# Patient Record
Sex: Female | Born: 1950 | Race: White | Hispanic: No | Smoking: Never smoker
Health system: Southern US, Community
[De-identification: ages and names within clinical notes are randomized; demographics above are authoritative.]

## PROBLEM LIST (undated history)

## (undated) DIAGNOSIS — R0602 Shortness of breath: Secondary | ICD-10-CM

## (undated) DIAGNOSIS — E7800 Pure hypercholesterolemia, unspecified: Secondary | ICD-10-CM

## (undated) DIAGNOSIS — C801 Malignant (primary) neoplasm, unspecified: Secondary | ICD-10-CM

## (undated) DIAGNOSIS — N83209 Unspecified ovarian cyst, unspecified side: Secondary | ICD-10-CM

## (undated) DIAGNOSIS — G629 Polyneuropathy, unspecified: Secondary | ICD-10-CM

## (undated) DIAGNOSIS — F32A Depression, unspecified: Secondary | ICD-10-CM

## (undated) DIAGNOSIS — R51 Headache: Secondary | ICD-10-CM

## (undated) DIAGNOSIS — F419 Anxiety disorder, unspecified: Secondary | ICD-10-CM

## (undated) DIAGNOSIS — R7303 Prediabetes: Secondary | ICD-10-CM

## (undated) DIAGNOSIS — J189 Pneumonia, unspecified organism: Secondary | ICD-10-CM

## (undated) DIAGNOSIS — M797 Fibromyalgia: Secondary | ICD-10-CM

## (undated) DIAGNOSIS — I10 Essential (primary) hypertension: Secondary | ICD-10-CM

## (undated) DIAGNOSIS — K589 Irritable bowel syndrome without diarrhea: Secondary | ICD-10-CM

## (undated) DIAGNOSIS — F329 Major depressive disorder, single episode, unspecified: Secondary | ICD-10-CM

## (undated) HISTORY — PX: OTHER SURGICAL HISTORY: SHX169

## (undated) HISTORY — DX: Fibromyalgia: M79.7

## (undated) HISTORY — DX: Irritable bowel syndrome, unspecified: K58.9

## (undated) HISTORY — DX: Pure hypercholesterolemia, unspecified: E78.00

## (undated) HISTORY — DX: Polyneuropathy, unspecified: G62.9

## (undated) HISTORY — PX: LAPAROSCOPIC CHOLECYSTECTOMY: SUR755

## (undated) HISTORY — PX: DILATION AND CURETTAGE OF UTERUS: SHX78

## (undated) HISTORY — DX: Depression, unspecified: F32.A

## (undated) HISTORY — DX: Major depressive disorder, single episode, unspecified: F32.9

---

## 1987-01-29 DIAGNOSIS — C801 Malignant (primary) neoplasm, unspecified: Secondary | ICD-10-CM

## 1987-01-29 HISTORY — PX: COLON RESECTION: SHX5231

## 1987-01-29 HISTORY — DX: Malignant (primary) neoplasm, unspecified: C80.1

## 1994-01-28 HISTORY — PX: TOTAL ABDOMINAL HYSTERECTOMY: SHX209

## 1998-09-27 ENCOUNTER — Ambulatory Visit (HOSPITAL_COMMUNITY): Admission: RE | Admit: 1998-09-27 | Discharge: 1998-09-27 | Payer: Self-pay | Admitting: Gastroenterology

## 1998-09-27 ENCOUNTER — Encounter: Payer: Self-pay | Admitting: Gastroenterology

## 1998-10-19 ENCOUNTER — Ambulatory Visit (HOSPITAL_COMMUNITY): Admission: RE | Admit: 1998-10-19 | Discharge: 1998-10-19 | Payer: Self-pay | Admitting: Gastroenterology

## 1998-10-31 ENCOUNTER — Ambulatory Visit (HOSPITAL_COMMUNITY): Admission: RE | Admit: 1998-10-31 | Discharge: 1998-10-31 | Payer: Self-pay | Admitting: Gastroenterology

## 1998-11-02 ENCOUNTER — Encounter: Payer: Self-pay | Admitting: Gastroenterology

## 2004-02-16 ENCOUNTER — Ambulatory Visit: Payer: Self-pay | Admitting: Internal Medicine

## 2005-05-27 ENCOUNTER — Ambulatory Visit: Payer: Self-pay | Admitting: Internal Medicine

## 2006-06-23 ENCOUNTER — Ambulatory Visit: Payer: Self-pay | Admitting: Internal Medicine

## 2007-01-29 HISTORY — PX: BREAST BIOPSY: SHX20

## 2007-09-01 ENCOUNTER — Ambulatory Visit: Payer: Self-pay | Admitting: Internal Medicine

## 2008-02-18 ENCOUNTER — Ambulatory Visit: Payer: Self-pay | Admitting: Internal Medicine

## 2008-08-03 ENCOUNTER — Ambulatory Visit: Payer: Self-pay | Admitting: Internal Medicine

## 2008-08-23 ENCOUNTER — Emergency Department: Payer: Self-pay | Admitting: Emergency Medicine

## 2008-09-13 ENCOUNTER — Ambulatory Visit: Payer: Self-pay | Admitting: Family Medicine

## 2009-09-18 ENCOUNTER — Ambulatory Visit: Payer: Self-pay | Admitting: Family Medicine

## 2010-12-12 ENCOUNTER — Ambulatory Visit: Payer: Self-pay | Admitting: Family Medicine

## 2011-09-18 ENCOUNTER — Emergency Department: Payer: Self-pay | Admitting: Emergency Medicine

## 2011-09-18 LAB — COMPREHENSIVE METABOLIC PANEL
Alkaline Phosphatase: 56 U/L (ref 50–136)
BUN: 13 mg/dL (ref 7–18)
Bilirubin,Total: 0.4 mg/dL (ref 0.2–1.0)
Chloride: 103 mmol/L (ref 98–107)
Creatinine: 0.77 mg/dL (ref 0.60–1.30)
EGFR (African American): >60
EGFR (Non-African Amer.): >60
Glucose: 148 mg/dL — ABNORMAL HIGH (ref 65–99)
Osmolality: 286 (ref 275–301)
SGPT (ALT): 22 U/L (ref 12–78)
Sodium: 142 mmol/L (ref 136–145)
Total Protein: 7.3 g/dL (ref 6.4–8.2)

## 2011-09-18 LAB — CBC WITH DIFFERENTIAL/PLATELET
Basophil #: 0 10*3/uL (ref 0.0–0.1)
Basophil %: 0.6 %
Eosinophil %: 4.8 %
HGB: 15.1 g/dL (ref 12.0–16.0)
MCH: 33.5 pg (ref 26.0–34.0)
Monocyte #: 0.4 x10 3/mm (ref 0.2–0.9)
Monocyte %: 7 %
Neutrophil %: 56.8 %
RBC: 4.52 10*6/uL (ref 3.80–5.20)

## 2011-09-18 LAB — URINALYSIS, COMPLETE
Bacteria: NONE SEEN
Bilirubin,UR: NEGATIVE
Glucose,UR: NEGATIVE mg/dL (ref 0–75)
Leukocyte Esterase: NEGATIVE
RBC,UR: 2 /HPF (ref 0–5)
Squamous Epithelial: 1
WBC UR: <1 /HPF (ref 0–5)

## 2011-09-18 LAB — TROPONIN I
Troponin-I: <0.02 ng/mL
Troponin-I: <0.02 ng/mL

## 2011-09-18 LAB — TSH: Thyroid Stimulating Horm: 2.32 u[IU]/mL

## 2012-01-01 ENCOUNTER — Ambulatory Visit: Payer: Self-pay | Admitting: Internal Medicine

## 2012-05-12 ENCOUNTER — Ambulatory Visit: Payer: Self-pay | Admitting: Internal Medicine

## 2012-05-28 ENCOUNTER — Encounter (INDEPENDENT_AMBULATORY_CARE_PROVIDER_SITE_OTHER): Payer: Self-pay

## 2012-05-29 ENCOUNTER — Encounter (INDEPENDENT_AMBULATORY_CARE_PROVIDER_SITE_OTHER): Payer: Self-pay | Admitting: Surgery

## 2012-05-29 ENCOUNTER — Ambulatory Visit (INDEPENDENT_AMBULATORY_CARE_PROVIDER_SITE_OTHER): Payer: Medicare Other | Admitting: Surgery

## 2012-05-29 VITALS — BP 118/64 | HR 76 | Resp 16 | Ht 67.0 in | Wt 195.0 lb

## 2012-05-29 DIAGNOSIS — K8020 Calculus of gallbladder without cholecystitis without obstruction: Secondary | ICD-10-CM

## 2012-05-29 HISTORY — DX: Calculus of gallbladder without cholecystitis without obstruction: K80.20

## 2012-05-29 NOTE — Patient Instructions (Signed)
If he decides to have surgery to remove her gallbladder, call the office and ask for the surgery scheduler's and they can help you get this scheduled

## 2012-05-29 NOTE — Progress Notes (Signed)
NAME: Suzanne [name] DOB: Apr 09, 1950 MRN: [id number]                                                                                      DATE: 05/29/2012  PCP: Hyman Hopes, MD Referring Provider: No ref. provider found  IMPRESSION:  Symptomatic gallstones  PLAN:   I have recommended lap chole. I have discussed the indications for laparoscopic cholecystectomy with her and provided educational material. We have discussed the risks of surgery, including general risks such as bleeding, infection, lung and heart issues etc. We have also discussed the potential for injuries to other organs, bile duct leaks, and other unexpected events. We have also talked about the fact that this may need to be converted to open under certain circumstances. We discussed the typical post op recovery and the fact that there is a good likelihood of improvement in symptoms and return to normal activity.  She understands this and wishes to proceed to schedule surgery. I believe all of her questions have been answered.  She has had prior abdominal surgery so may make LC more difficult or mean she needs open                    CC:  Chief Complaint  Patient presents with  . Cholelithiasis    HPI:  Suzanne [name] is a 62 y.o.  female who presents for evaluation of gallstones.she has been having intermittent problems with some abdominal pain, right upper quadrant but a little bit into the right CVA area. She has some mild nausea. She has not been to the emergency room for this. She discusses with her primary physician who ordered a gallbladder ultrasound. This showed cholelithiasis without evidence of acute cholecystitis. Hepatic steatosis was incidentally noted. She was referred for possible cholecystectomy. 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 She also had problems with constipation and developed a chronic anal fissure and was treated here for  that back in 2002 by Dr. Lorelee New.  PMH:  has a past medical history of Hypercholesteremia; Fibromyalgia; Depression; Neuropathy; and IBS (irritable bowel syndrome).  PSH:   has past surgical history that includes Colon resection (1989) and Total abdominal hysterectomy (1996).  ALLERGIES:   Allergies  Allergen Reactions  . Contrast Media (Iodinated Diagnostic Agents) Swelling    MEDICATIONS: Current outpatient prescriptions:CLONAZEPAM PO, Take 2 tablets by mouth daily., Disp: , Rfl: ;  Coenzyme Q10 (CO Q 10 PO), Take by mouth daily., Disp: , Rfl: ;  Fenofibrate (TRICOR PO), Take 1 tablet by mouth daily., Disp: , Rfl: ;  gabapentin (NEURONTIN) 800 MG tablet, Take 800 mg by mouth 3 (three) times daily., Disp: , Rfl: ;  pravastatin (PRAVACHOL) 80 MG tablet, Take 80 mg by mouth daily., Disp: , Rfl:  TRIAMTERENE PO, Take 1 tablet by mouth daily., Disp: , Rfl: ;  Venlafaxine HCl (EFFEXOR PO), Take 2 tablets by mouth daily., Disp: , Rfl:   ROS: She has filled out our 12 point review of systems and it is negative except for IBS. EXAM:  VITAL SIGNS:  BP 118/64  Pulse 76  Resp 16  Ht 5\' 7"  (1.702 m)  Wt 195 lb (88.451 kg)  BMI 30.53 kg/m2  GENERAL:  The patient is alert, oriented, and generally healthy-appearing, NAD. Mood and affect are normal.  HEENT:  The head is normocephalic, the eyes nonicteric, the pupils were round regular and equal. EOMs are normal. Pharynx normal. Dentition good.  NECK:  The neck is supple and there are no masses or thyromegaly.  LUNGS: Normal respirations and clear to auscultation.  HEART: Regular rhythm, with no murmurs rubs or gallops. Pulses are intact carotid dorsalis pedis and posterior tibial. No significant varicosities are noted.  ABDOMEN: Soft, flat, and but very mildly tender in the right subcostal area, without guarding or rebound. No masses or organomegaly is noted. No hernias are noted. Bowel sounds are normal.  EXTREMITIES:  Good  range of motion, no edema.   DATA REVIEWED:  I have reviewed the old office notes including copies of notes going back to her original colon surgery. I also reviewed the ultrasound report    Suzanne [name] J 05/29/2012  CC: No ref. provider found, [name], [name], MD

## 2012-06-01 ENCOUNTER — Encounter (INDEPENDENT_AMBULATORY_CARE_PROVIDER_SITE_OTHER): Payer: Self-pay

## 2012-06-19 ENCOUNTER — Encounter (HOSPITAL_COMMUNITY): Payer: Self-pay | Admitting: Pharmacy Technician

## 2012-06-23 ENCOUNTER — Other Ambulatory Visit (INDEPENDENT_AMBULATORY_CARE_PROVIDER_SITE_OTHER): Payer: Self-pay | Admitting: Surgery

## 2012-06-24 ENCOUNTER — Encounter (HOSPITAL_COMMUNITY)
Admission: RE | Admit: 2012-06-24 | Discharge: 2012-06-24 | Disposition: A | Discharge status: Home / Self Care | Payer: Self-pay | Source: Ambulatory Visit | Attending: Surgery | Admitting: Surgery

## 2012-06-24 NOTE — Pre-Procedure Instructions (Signed)
Suzanne [name]  06/24/2012   Your procedure is scheduled on:  Wednesday, June 4th  Report to [facility] Short Stay Center at 0630 AM.  Come to main entrance "A" and go to east elevators go to 3rd floor and check in at Short Stay.  Call this number if you have problems the morning of surgery: [phone]   Remember:   Do not eat food or drink liquids after midnight.    Take these medicines the morning of surgery with A SIP OF WATER: Effexor, Klonopin if needed, Tylenol if needed, Neurontin   Do not wear jewelry, make-up or nail polish.  Do not wear lotions, powders, or perfumes, deodorant.  Do not shave 48 hours prior to surgery. Men may shave face and neck.  Do not bring valuables to the hospital.  Contacts, dentures or bridgework may not be worn into surgery.  Leave suitcase in the car. After surgery it may be brought to your room.  For patients admitted to the hospital, checkout time is 11:00 AM the day of discharge.   Patients discharged the day of surgery will not be allowed to drive home.    Special Instructions: Shower using CHG 2 nights before surgery and the night before surgery.  If you shower the day of surgery use CHG.  Use special wash - you have one bottle of CHG for all showers.  You should use approximately 1/3 of the bottle for each shower.   Please read over the following fact sheets that you were given: Pain Booklet, Coughing and Deep Breathing, MRSA Information and Surgical Site Infection Prevention

## 2012-07-17 ENCOUNTER — Encounter (HOSPITAL_COMMUNITY): Payer: Self-pay

## 2012-07-17 ENCOUNTER — Encounter (HOSPITAL_COMMUNITY)
Admission: RE | Admit: 2012-07-17 | Discharge: 2012-07-17 | Disposition: A | Discharge status: Home / Self Care | Payer: Medicare Other | Source: Ambulatory Visit | Attending: Surgery | Admitting: Surgery

## 2012-07-17 HISTORY — DX: Essential (primary) hypertension: I10

## 2012-07-17 HISTORY — DX: Shortness of breath: R06.02

## 2012-07-17 HISTORY — DX: Malignant (primary) neoplasm, unspecified: C80.1

## 2012-07-17 HISTORY — DX: Unspecified ovarian cyst, unspecified side: N83.209

## 2012-07-17 HISTORY — DX: Anxiety disorder, unspecified: F41.9

## 2012-07-17 HISTORY — DX: Headache: R51

## 2012-07-17 HISTORY — DX: Pneumonia, unspecified organism: J18.9

## 2012-07-17 LAB — CBC WITH DIFFERENTIAL/PLATELET
Basophils Absolute: 0 10*3/uL (ref 0.0–0.1)
Basophils Relative: 1 % (ref 0–1)
Eosinophils Absolute: 0.2 10*3/uL (ref 0.0–0.7)
Eosinophils Relative: 3 % (ref 0–5)
Lymphs Abs: 2.2 10*3/uL (ref 0.7–4.0)
MCH: 32.5 pg (ref 26.0–34.0)
MCHC: 34.2 g/dL (ref 30.0–36.0)
MCV: 95.1 fL (ref 78.0–100.0)
Neutrophils Relative %: 49 % (ref 43–77)
Platelets: 295 10*3/uL (ref 150–400)
RBC: 4.28 MIL/uL (ref 3.87–5.11)
RDW: 12.8 % (ref 11.5–15.5)

## 2012-07-17 LAB — COMPREHENSIVE METABOLIC PANEL
ALT: 19 U/L (ref 0–35)
AST: 28 U/L (ref 0–37)
Albumin: 3.9 g/dL (ref 3.5–5.2)
Alkaline Phosphatase: 31 U/L — ABNORMAL LOW (ref 39–117)
Calcium: 9.2 mg/dL (ref 8.4–10.5)
GFR calc Af Amer: 79 mL/min — ABNORMAL LOW (ref >90)
Potassium: 3.3 mEq/L — ABNORMAL LOW (ref 3.5–5.1)
Sodium: 141 mEq/L (ref 135–145)
Total Protein: 6.8 g/dL (ref 6.0–8.3)

## 2012-07-17 LAB — SURGICAL PCR SCREEN: MRSA, PCR: NEGATIVE

## 2012-07-17 NOTE — Pre-Procedure Instructions (Signed)
AQUA DENSLOW  07/17/2012   Your procedure is scheduled on: Wednesday, July 29, 2012  Report to [facility] Short Stay Center at 9:30 AM.  Call this number if you have problems the morning of surgery: [phone]   Remember:   Do not eat food or drink liquids after midnight.   Take these medicines the morning of surgery with A SIP OF WATER: gabapentin (NEURONTIN) 800 MG tablet, venlafaxine XR (EFFEXOR-XR) 150 MG 24 hr capsule if needed: acetaminophen (TYLENOL) 500 MG tablet for pain, clonazePAM (KLONOPIN) 0.5 MG tablet for anxiety Stop taking Aspirin  and herbal medications. Do not take any NSAIDs ie: Ibuprofen, Advil, Naproxen or any medication containing Aspirin. Stop taking -Pseudoephedrine ( found in Allergy Relief D)  Do not wear jewelry, make-up or nail polish.  Do not wear lotions, powders, or perfumes. You may wear deodorant.  Do not shave 48 hours prior to surgery.  Do not bring valuables to the hospital.  [facility] is not responsible  for any belongings or valuables.  Contacts, dentures or bridgework may not be worn into surgery.  Leave suitcase in the car. After surgery it may be brought to your room.  For patients admitted to the hospital, checkout time is 11:00 AM the day of discharge.   Patients discharged the day of surgery will not be allowed to drive home.  Name and phone number of your driver:   Special Instructions: Shower using CHG 2 nights before surgery and the night before surgery.  If you shower the day of surgery use CHG.  Use special wash - you have one bottle of CHG for all showers.  You should use approximately 1/3 of the bottle for each shower.   Please read over the following fact sheets that you were given: Pain Booklet, Coughing and Deep Breathing, MRSA Information and Surgical Site Infection Prevention

## 2012-07-17 NOTE — Progress Notes (Signed)
Pt denies SOB, chest pain , and being under the care of a cardiologist. Pt states that she was in the hospital around July 2013 or so for chest pain. Pt believes that she had an EKG and chest x ray done around that time although, not sure of the exact date. Results of the EKG, chest x ray, any cardiac studies and latest office notes were requested from [facility].

## 2012-07-21 ENCOUNTER — Other Ambulatory Visit (HOSPITAL_COMMUNITY): Payer: Self-pay

## 2012-07-22 ENCOUNTER — Telehealth (INDEPENDENT_AMBULATORY_CARE_PROVIDER_SITE_OTHER): Payer: Self-pay

## 2012-07-22 NOTE — Telephone Encounter (Signed)
Called pt with lab results - all normal with the exception of potassium.  Advised pt to start taking something for her low potassium level per Dr. Jamey Ripa.

## 2012-07-23 NOTE — Telephone Encounter (Signed)
Patient calling - she spoke with the pharmacist and they did not know what dose to recommend to patient since they did not know how low her potassium was. Patient got over the counter 595 mg Potassium to take once a day. I advised this should be okay since her potassium was not very low. She will take this until surgery. I told her I would make Dr Jamey Ripa aware and we would contact her if this is not okay.

## 2012-07-24 NOTE — Telephone Encounter (Signed)
This should work

## 2012-07-28 ENCOUNTER — Encounter (INDEPENDENT_AMBULATORY_CARE_PROVIDER_SITE_OTHER): Payer: Medicare Other | Admitting: Surgery

## 2012-07-28 MED ORDER — CEFAZOLIN SODIUM-DEXTROSE 2-3 GM-% IV SOLR
2.0000 g | INTRAVENOUS | Status: AC
  Administered 2012-07-29: 2 g via INTRAVENOUS
  Filled 2012-07-28: qty 50

## 2012-07-29 ENCOUNTER — Encounter (HOSPITAL_COMMUNITY): Payer: Self-pay | Admitting: Anesthesiology

## 2012-07-29 ENCOUNTER — Observation Stay (HOSPITAL_COMMUNITY)
Admission: RE | Admit: 2012-07-29 | Discharge: 2012-07-31 | Disposition: A | Discharge status: Home / Self Care | Payer: Medicare Other | Source: Ambulatory Visit | Attending: Surgery | Admitting: Surgery

## 2012-07-29 ENCOUNTER — Ambulatory Visit (HOSPITAL_COMMUNITY): Payer: Medicare Other | Admitting: Anesthesiology

## 2012-07-29 ENCOUNTER — Ambulatory Visit (HOSPITAL_COMMUNITY): Payer: Medicare Other

## 2012-07-29 ENCOUNTER — Encounter (HOSPITAL_COMMUNITY)
Admission: RE | Disposition: A | Discharge status: Home / Self Care | Payer: Self-pay | Source: Ambulatory Visit | Attending: Surgery

## 2012-07-29 DIAGNOSIS — Z01812 Encounter for preprocedural laboratory examination: Secondary | ICD-10-CM | POA: Insufficient documentation

## 2012-07-29 DIAGNOSIS — K8010 Calculus of gallbladder with chronic cholecystitis without obstruction: Secondary | ICD-10-CM

## 2012-07-29 DIAGNOSIS — I10 Essential (primary) hypertension: Secondary | ICD-10-CM | POA: Insufficient documentation

## 2012-07-29 HISTORY — PX: CHOLECYSTECTOMY: SHX55

## 2012-07-29 SURGERY — LAPAROSCOPIC CHOLECYSTECTOMY WITH INTRAOPERATIVE CHOLANGIOGRAM
Anesthesia: General | Site: Abdomen | Wound class: Contaminated

## 2012-07-29 MED ORDER — CHLORHEXIDINE GLUCONATE 4 % EX LIQD: 1.0000 "application " | Freq: Once | CUTANEOUS | Status: DC

## 2012-07-29 MED ORDER — ROCURONIUM BROMIDE 100 MG/10ML IV SOLN
INTRAVENOUS | Status: DC | PRN
  Administered 2012-07-29: 50 mg via INTRAVENOUS

## 2012-07-29 MED ORDER — FENTANYL CITRATE 0.05 MG/ML IJ SOLN
INTRAMUSCULAR | Status: DC | PRN
  Administered 2012-07-29 (×5): 50 ug via INTRAVENOUS

## 2012-07-29 MED ORDER — SODIUM CHLORIDE 0.9 % IV SOLN
INTRAVENOUS | Status: DC | PRN
  Administered 2012-07-29: 12:00:00

## 2012-07-29 MED ORDER — LIDOCAINE HCL (CARDIAC) 20 MG/ML IV SOLN
INTRAVENOUS | Status: DC | PRN
  Administered 2012-07-29: 50 mg via INTRAVENOUS

## 2012-07-29 MED ORDER — KCL IN DEXTROSE-NACL 20-5-0.45 MEQ/L-%-% IV SOLN
INTRAVENOUS | Status: DC
  Filled 2012-07-29 (×5): qty 1000

## 2012-07-29 MED ORDER — OXYCODONE HCL 5 MG PO TABS: 5.0000 mg | ORAL_TABLET | Freq: Once | ORAL | Status: DC | PRN

## 2012-07-29 MED ORDER — NEOSTIGMINE METHYLSULFATE 1 MG/ML IJ SOLN
INTRAMUSCULAR | Status: DC | PRN
  Administered 2012-07-29: 4 mg via INTRAVENOUS

## 2012-07-29 MED ORDER — ONDANSETRON HCL 4 MG/2ML IJ SOLN
4.0000 mg | Freq: Four times a day (QID) | INTRAMUSCULAR | Status: DC | PRN
  Administered 2012-07-31 (×2): 4 mg via INTRAVENOUS
  Filled 2012-07-29 (×3): qty 2

## 2012-07-29 MED ORDER — OXYCODONE-ACETAMINOPHEN 5-325 MG PO TABS: 1.0000 | ORAL_TABLET | ORAL | Status: DC | PRN

## 2012-07-29 MED ORDER — MIDAZOLAM HCL 5 MG/5ML IJ SOLN
INTRAMUSCULAR | Status: DC | PRN
  Administered 2012-07-29: 2 mg via INTRAVENOUS

## 2012-07-29 MED ORDER — DEXAMETHASONE SODIUM PHOSPHATE 4 MG/ML IJ SOLN
INTRAMUSCULAR | Status: DC | PRN
  Administered 2012-07-29: 4 mg via INTRAVENOUS

## 2012-07-29 MED ORDER — HYDROMORPHONE HCL PF 1 MG/ML IJ SOLN
INTRAMUSCULAR | Status: AC
  Administered 2012-07-29: 0.5 mg via INTRAVENOUS
  Filled 2012-07-29: qty 1

## 2012-07-29 MED ORDER — ONDANSETRON HCL 4 MG/2ML IJ SOLN
INTRAMUSCULAR | Status: DC | PRN
  Administered 2012-07-29: 4 mg via INTRAVENOUS

## 2012-07-29 MED ORDER — ACETAMINOPHEN 325 MG PO TABS
650.0000 mg | ORAL_TABLET | ORAL | Status: DC | PRN
  Administered 2012-07-31 (×2): 650 mg via ORAL
  Filled 2012-07-29 (×2): qty 2

## 2012-07-29 MED ORDER — LIDOCAINE HCL 4 % MT SOLN
OROMUCOSAL | Status: DC | PRN
  Administered 2012-07-29: 4 mL via TOPICAL

## 2012-07-29 MED ORDER — BUPIVACAINE-EPINEPHRINE PF 0.25-1:200000 % IJ SOLN
INTRAMUSCULAR | Status: AC
  Filled 2012-07-29: qty 30

## 2012-07-29 MED ORDER — MORPHINE SULFATE 10 MG/ML IJ SOLN
INTRAMUSCULAR | Status: DC | PRN
  Administered 2012-07-29: 4 mg via INTRAVENOUS
  Administered 2012-07-29: 2 mg via INTRAVENOUS
  Administered 2012-07-29: 4 mg via INTRAVENOUS

## 2012-07-29 MED ORDER — PROPOFOL 10 MG/ML IV BOLUS
INTRAVENOUS | Status: DC | PRN
  Administered 2012-07-29: 150 mg via INTRAVENOUS

## 2012-07-29 MED ORDER — HEMOSTATIC AGENTS (NO CHARGE) OPTIME
TOPICAL | Status: DC | PRN
  Administered 2012-07-29: 1 via TOPICAL

## 2012-07-29 MED ORDER — ACETAMINOPHEN 650 MG RE SUPP: 650.0000 mg | RECTAL | Status: DC | PRN

## 2012-07-29 MED ORDER — ONDANSETRON HCL 4 MG/2ML IJ SOLN: 4.0000 mg | Freq: Four times a day (QID) | INTRAMUSCULAR | Status: DC | PRN

## 2012-07-29 MED ORDER — GLYCOPYRROLATE 0.2 MG/ML IJ SOLN
INTRAMUSCULAR | Status: DC | PRN
  Administered 2012-07-29: 0.6 mg via INTRAVENOUS

## 2012-07-29 MED ORDER — OXYCODONE HCL 5 MG PO TABS
5.0000 mg | ORAL_TABLET | ORAL | Status: DC | PRN
  Administered 2012-07-29: 5 mg via ORAL
  Administered 2012-07-30 (×2): 10 mg via ORAL
  Filled 2012-07-29: qty 2
  Filled 2012-07-29: qty 1
  Filled 2012-07-29 (×2): qty 2

## 2012-07-29 MED ORDER — OXYCODONE HCL 5 MG/5ML PO SOLN: 5.0000 mg | Freq: Once | ORAL | Status: DC | PRN

## 2012-07-29 MED ORDER — HYDROMORPHONE HCL PF 2 MG/ML IJ SOLN
2.0000 mg | INTRAMUSCULAR | Status: DC | PRN
  Administered 2012-07-29: 1 mg via INTRAVENOUS
  Administered 2012-07-30 (×2): 2 mg via INTRAVENOUS
  Filled 2012-07-29 (×3): qty 2

## 2012-07-29 MED ORDER — ARTIFICIAL TEARS OP OINT
TOPICAL_OINTMENT | OPHTHALMIC | Status: DC | PRN
  Administered 2012-07-29: 1 via OPHTHALMIC

## 2012-07-29 MED ORDER — LACTATED RINGERS IV SOLN
INTRAVENOUS | Status: DC | PRN
  Administered 2012-07-29 (×2): via INTRAVENOUS

## 2012-07-29 MED ORDER — KCL IN DEXTROSE-NACL 20-5-0.45 MEQ/L-%-% IV SOLN
INTRAVENOUS | Status: AC
  Filled 2012-07-29: qty 1000

## 2012-07-29 MED ORDER — SODIUM CHLORIDE 0.9 % IJ SOLN
3.0000 mL | Freq: Two times a day (BID) | INTRAMUSCULAR | Status: DC
  Administered 2012-07-30 – 2012-07-31 (×3): 3 mL via INTRAVENOUS

## 2012-07-29 MED ORDER — MORPHINE SULFATE 2 MG/ML IJ SOLN
2.0000 mg | INTRAMUSCULAR | Status: DC | PRN
  Administered 2012-07-30: 2 mg via INTRAVENOUS
  Filled 2012-07-29: qty 1

## 2012-07-29 MED ORDER — BUPIVACAINE HCL (PF) 0.25 % IJ SOLN
INTRAMUSCULAR | Status: DC | PRN
  Administered 2012-07-29: 15 mL

## 2012-07-29 MED ORDER — SODIUM CHLORIDE 0.9 % IR SOLN
Status: DC | PRN
  Administered 2012-07-29: 1000 mL

## 2012-07-29 MED ORDER — SODIUM CHLORIDE 0.9 % IV SOLN
250.0000 mL | INTRAVENOUS | Status: DC | PRN
  Administered 2012-07-31: 250 mL via INTRAVENOUS

## 2012-07-29 MED ORDER — 0.9 % SODIUM CHLORIDE (POUR BTL) OPTIME
TOPICAL | Status: DC | PRN
  Administered 2012-07-29: 1000 mL

## 2012-07-29 MED ORDER — HYDROMORPHONE HCL PF 1 MG/ML IJ SOLN
0.2500 mg | INTRAMUSCULAR | Status: DC | PRN
  Administered 2012-07-29: 0.5 mg via INTRAVENOUS

## 2012-07-29 MED ORDER — LACTATED RINGERS IV SOLN
INTRAVENOUS | Status: DC
  Administered 2012-07-29: 11:00:00 via INTRAVENOUS

## 2012-07-29 MED ORDER — SODIUM CHLORIDE 0.9 % IJ SOLN: 3.0000 mL | INTRAMUSCULAR | Status: DC | PRN

## 2012-07-29 SURGICAL SUPPLY — 46 items
APPLIER CLIP 5 13 M/L LIGAMAX5 (MISCELLANEOUS) ×2
APPLIER CLIP ROT 10 11.4 M/L (STAPLE) ×2
BLADE SURG ROTATE 9660 (MISCELLANEOUS) IMPLANT
CANISTER SUCTION 2500CC (MISCELLANEOUS) ×2 IMPLANT
CHLORAPREP W/TINT 26ML (MISCELLANEOUS) ×2 IMPLANT
CLIP APPLIE 5 13 M/L LIGAMAX5 (MISCELLANEOUS) ×1 IMPLANT
CLIP APPLIE ROT 10 11.4 M/L (STAPLE) ×1 IMPLANT
CLOTH BEACON ORANGE TIMEOUT ST (SAFETY) ×2 IMPLANT
COVER MAYO STAND STRL (DRAPES) ×2 IMPLANT
COVER SURGICAL LIGHT HANDLE (MISCELLANEOUS) ×2 IMPLANT
DECANTER SPIKE VIAL GLASS SM (MISCELLANEOUS) IMPLANT
DERMABOND ADVANCED (GAUZE/BANDAGES/DRESSINGS) ×1
DERMABOND ADVANCED .7 DNX12 (GAUZE/BANDAGES/DRESSINGS) ×1 IMPLANT
DEVICE TROCAR PUNCTURE CLOSURE (ENDOMECHANICALS) ×2 IMPLANT
DRAPE C-ARM 42X72 X-RAY (DRAPES) ×2 IMPLANT
DRAPE UTILITY 15X26 W/TAPE STR (DRAPE) ×4 IMPLANT
ELECT REM PT RETURN 9FT ADLT (ELECTROSURGICAL) ×2
ELECTRODE REM PT RTRN 9FT ADLT (ELECTROSURGICAL) ×1 IMPLANT
FILTER SMOKE EVAC LAPAROSHD (FILTER) IMPLANT
GLOVE BIOGEL PI IND STRL 6 (GLOVE) ×1 IMPLANT
GLOVE BIOGEL PI IND STRL 7.5 (GLOVE) ×3 IMPLANT
GLOVE BIOGEL PI INDICATOR 6 (GLOVE) ×1
GLOVE BIOGEL PI INDICATOR 7.5 (GLOVE) ×3
GLOVE EUDERMIC 7 POWDERFREE (GLOVE) ×2 IMPLANT
GLOVE SURG SS PI 7.0 STRL IVOR (GLOVE) ×4 IMPLANT
GLOVE SURG SS PI 7.5 STRL IVOR (GLOVE) ×6 IMPLANT
GOWN STRL NON-REIN LRG LVL3 (GOWN DISPOSABLE) ×4 IMPLANT
GOWN STRL REIN XL XLG (GOWN DISPOSABLE) ×4 IMPLANT
HEMOSTAT SNOW SURGICEL 2X4 (HEMOSTASIS) ×2 IMPLANT
KIT BASIN OR (CUSTOM PROCEDURE TRAY) ×2 IMPLANT
KIT ROOM TURNOVER OR (KITS) ×2 IMPLANT
NS IRRIG 1000ML POUR BTL (IV SOLUTION) ×2 IMPLANT
PAD ARMBOARD 7.5X6 YLW CONV (MISCELLANEOUS) ×2 IMPLANT
POUCH SPECIMEN RETRIEVAL 10MM (ENDOMECHANICALS) ×2 IMPLANT
SCISSORS LAP 5X35 DISP (ENDOMECHANICALS) IMPLANT
SET CHOLANGIOGRAPH 5 50 .035 (SET/KITS/TRAYS/PACK) ×2 IMPLANT
SET IRRIG TUBING LAPAROSCOPIC (IRRIGATION / IRRIGATOR) ×2 IMPLANT
SLEEVE ENDOPATH XCEL 5M (ENDOMECHANICALS) ×2 IMPLANT
SPECIMEN JAR SMALL (MISCELLANEOUS) ×2 IMPLANT
SUT MNCRL AB 4-0 PS2 18 (SUTURE) ×2 IMPLANT
TOWEL OR 17X24 6PK STRL BLUE (TOWEL DISPOSABLE) ×2 IMPLANT
TOWEL OR 17X26 10 PK STRL BLUE (TOWEL DISPOSABLE) ×2 IMPLANT
TRAY LAPAROSCOPIC (CUSTOM PROCEDURE TRAY) ×2 IMPLANT
TROCAR XCEL BLUNT TIP 100MML (ENDOMECHANICALS) ×2 IMPLANT
TROCAR XCEL NON-BLD 11X100MML (ENDOMECHANICALS) ×4 IMPLANT
TROCAR XCEL NON-BLD 5MMX100MML (ENDOMECHANICALS) ×2 IMPLANT

## 2012-07-29 NOTE — H&P (Signed)
  HPI: Suzanne [name] is a 62 y.o. female who presents for cholecystectomy.she has been having intermittent problems with some abdominal pain, right upper quadrant but a little bit into the right CVA area. She has some mild nausea. She has not been to the emergency room for this. She discussed with her primary physician who ordered a gallbladder ultrasound. This showed cholelithiasis without evidence of acute cholecystitis. Hepatic steatosis was incidentally noted. She was referred for possible cholecystectomy. 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 She also had problems with constipation and developed a chronic anal fissure and was treated here for that back in 2002 by Dr. Lorelee New.  PMH: has a past medical history of Hypercholesteremia; Fibromyalgia; Depression; Neuropathy; and IBS (irritable bowel syndrome).  PSH: has past surgical history that includes Colon resection (1989) and Total abdominal hysterectomy (1996).  ALLERGIES:  Allergies   Allergen  Reactions   .  Contrast Media (Iodinated Diagnostic Agents)  Swelling   MEDICATIONS: Current outpatient prescriptions:CLONAZEPAM PO, Take 2 tablets by mouth daily., Disp: , Rfl: ; Coenzyme Q10 (CO Q 10 PO), Take by mouth daily., Disp: , Rfl: ; Fenofibrate (TRICOR PO), Take 1 tablet by mouth daily., Disp: , Rfl: ; gabapentin (NEURONTIN) 800 MG tablet, Take 800 mg by mouth 3 (three) times daily., Disp: , Rfl: ; pravastatin (PRAVACHOL) 80 MG tablet, Take 80 mg by mouth daily., Disp: , Rfl:  TRIAMTERENE PO, Take 1 tablet by mouth daily., Disp: , Rfl: ; Venlafaxine HCl (EFFEXOR PO), Take 2 tablets by mouth daily., Disp: , Rfl:  ROS: She  has filled out our 12 point review of systems and it is negative except for IBS.  EXAM:  VITAL SIGNS:  BP 170/71  Pulse 84  Temp(Src) 97.6 F (36.4 C) (Oral)  Resp 18  SpO2 96%  GENERAL:  The patient is alert, oriented, and generally  healthy-appearing, NAD. Mood and affect are normal.  HEENT:  The head is normocephalic, the eyes nonicteric, the pupils were round regular and equal. EOMs are normal. Pharynx normal. Dentition good.  NECK:  The neck is supple and there are no masses or thyromegaly.  LUNGS:  Normal respirations and clear to auscultation.  HEART:  Regular rhythm, with no murmurs rubs or gallops. Pulses are intact carotid dorsalis pedis and posterior tibial. No significant varicosities are noted.  ABDOMEN:  Soft, flat, and but very mildly tender in the right subcostal area, without guarding or rebound. No masses or organomegaly is noted. No hernias are noted. Bowel sounds are normal.  EXTREMITIES:  Good range of motion, no edema.  Imp: Symptomatic gallstones  Plan: Lap chole with IOC. Reviewed the plans with the patient again and all questions answered

## 2012-07-29 NOTE — Progress Notes (Signed)
REPORT CALLED AND TRANSFERRED TO 6N3 AFTER REPORT CALL TO RN FOR [phone]

## 2012-07-29 NOTE — Anesthesia Preprocedure Evaluation (Addendum)
Anesthesia Evaluation  Patient identified by MRN, date of birth, ID band Patient awake    Reviewed: Allergy & Precautions, H&P , NPO status , Patient's Chart, lab work & pertinent test results  Airway Mallampati: II  Neck ROM: full    Dental  (+) Dental Advisory Given   Pulmonary shortness of breath,          Cardiovascular hypertension,     Neuro/Psych  Headaches, Anxiety Depression  Neuromuscular disease    GI/Hepatic   Endo/Other    Renal/GU      Musculoskeletal  (+) Fibromyalgia -  Abdominal   Peds  Hematology   Anesthesia Other Findings   Reproductive/Obstetrics                          Anesthesia Physical Anesthesia Plan  ASA: II  Anesthesia Plan: General   Post-op Pain Management:    Induction: Intravenous  Airway Management Planned: Oral ETT  Additional Equipment:   Intra-op Plan:   Post-operative Plan: Extubation in OR  Informed Consent: I have reviewed the patients History and Physical, chart, labs and discussed the procedure including the risks, benefits and alternatives for the proposed anesthesia with the patient or authorized representative who has indicated his/her understanding and acceptance.     Plan Discussed with: CRNA, Anesthesiologist and Surgeon  Anesthesia Plan Comments:         Anesthesia Quick Evaluation

## 2012-07-29 NOTE — Transfer of Care (Signed)
Immediate Anesthesia Transfer of Care Note  Patient: Suzanne [name]  Procedure(s) Performed: Procedure(s): LAPAROSCOPIC CHOLECYSTECTOMY WITH INTRAOPERATIVE CHOLANGIOGRAM (N/A)  Patient Location: PACU  Anesthesia Type:General  Level of Consciousness: awake and alert   Airway & Oxygen Therapy: Patient Spontanous Breathing and Patient connected to nasal cannula oxygen  Post-op Assessment: Report given to PACU RN, Post -op Vital signs reviewed and stable and Patient moving all extremities  Post vital signs: Reviewed and stable  Complications: No apparent anesthesia complications

## 2012-07-29 NOTE — Op Note (Signed)
Suzanne [name] Feb 12, 1950 [id number] 06/01/2012  Preoperative diagnosis: chronic calculus cholecystitis  Postoperative diagnosis: the same  Procedure: laparoscopic cholecystectomy with intraoperative cholangiogram  Surgeon: Currie Paris, MD, FACS  Assistant surgeon: Dr. Claud Kelp   Anesthesia: General  Clinical History and Indications: This patient has known gallstones and comes in today for cholecystectomy.  Description of procedure: The patient was seen in the preoperative area. I reviewed the plans for the procedure with her as well as the risks and complications. She had no further questions and wished to proceed.  The patient was taken to the operating room. After satisfactory general endotracheal anesthesia had been obtained the abdomen was prepped and draped. A time out was done.  0.25% plain Marcaine was used at all incisions.I made a small incision in the right upper quadrant and using Optiview in the 5 mm camera was able to enter the abdomen safely under direct vision. Once the abdomen was insufflated I could see there were numerous adhesions to the old midline around the umbilicus from her prior surgery. However, the epigastric area was free of adhesions I was able to place a 10 mm trocar under direct vision at this point. The camera was placed into that incision and using scissors I was able to take adhesions down around the umbilicus. I then made short umbilical incision and inserted a 1011 trocar under direct vision. The camera was placed there.I then placed a final 5 mm trocar under direct vision in the right upper quadrant. The gallbladder was grasped and retracted over the liver. There were no lesions to the gallbladder. The liver looked completely normal.I.I opened the peritoneum over the cystic duct and identified a segment of cystic duct and cystic artery. Clips were placed on each. An intraoperative cholangiogram was then performed. A Cook catheter was  introduced percutaneously and placed in the cystic duct. The cholangiogram showed good filling of the common duct and hepatic radicals and free flow into the duodenum. No abnormalities were noted.  The catheter was removed and 3 clips placed on the stay side of the cystic duct. The duct was then divided.  Additional clips are placed on the cystic artery and it was divided. The gallbladder was then removed from below to above the coagulation current of the cautery. It was then placed in a bag to be retrieved later.  The abdomen was irrigated and a check for hemostasis along the bed of the gallbladder made.there is a small bleeding point above the posterior cystic artery which was clipped for some extra clips. I also put some snow in this area but appeared completely dry. Once everything appeared to be dry we were able to move the camera to the epigastric port and removed the gallbladder through the umbilical port.  The abdomen was reinsufflated and a final check for hemostasis made. There is no evidence of bleeding or bile leakage.the umbilical site was closed with a 0 Vicryl using an Endo Close. The lateral ports were removed under direct vision and there was no bleeding. The abdomen was then deflated through the epigastric port and that was removed. Skin was closed with 4-0 Monocryl subcuticular and Dermabond.  The patient tolerated the procedure well. There were no operative complications. EBL was minimal. All counts were correct.  Currie Paris, MD, FACS 07/29/2012 12:40 PM

## 2012-07-29 NOTE — Anesthesia Procedure Notes (Signed)
Procedure Name: Intubation Date/Time: 07/29/2012 11:26 AM Performed by: Luster Landsberg Pre-anesthesia Checklist: Patient identified, Emergency Drugs available, Suction available and Patient being monitored Patient Re-evaluated:Patient Re-evaluated prior to inductionOxygen Delivery Method: Circle system utilized Preoxygenation: Pre-oxygenation with 100% oxygen Intubation Type: IV induction Ventilation: Mask ventilation without difficulty and Oral airway inserted - appropriate to patient size Laryngoscope Size: Mac and 3 Grade View: Grade I Tube size: 7.5 mm Number of attempts: 1 Airway Equipment and Method: Stylet and LTA kit utilized Placement Confirmation: ETT inserted through vocal cords under direct vision,  positive ETCO2 and breath sounds checked- equal and bilateral Secured at: 21 cm Tube secured with: Tape Dental Injury: Teeth and Oropharynx as per pre-operative assessment

## 2012-07-29 NOTE — Anesthesia Postprocedure Evaluation (Signed)
Anesthesia Post Note  Patient: Suzanne [name]  Procedure(s) Performed: Procedure(s) (LRB): LAPAROSCOPIC CHOLECYSTECTOMY WITH INTRAOPERATIVE CHOLANGIOGRAM (N/A)  Anesthesia type: General  Patient location: PACU  Post pain: Pain level controlled and Adequate analgesia  Post assessment: Post-op Vital signs reviewed, Patient's Cardiovascular Status Stable, Respiratory Function Stable, Patent Airway and Pain level controlled  Last Vitals:  Filed Vitals:   07/29/12 1345  BP: 123/74  Pulse: 90  Temp:   Resp:     Post vital signs: Reviewed and stable  Level of consciousness: awake, alert  and oriented  Complications: No apparent anesthesia complications

## 2012-07-29 NOTE — Progress Notes (Signed)
DR [facility] NOTIFIED OF C/O PAIN AND ORDER NOTED

## 2012-07-30 ENCOUNTER — Observation Stay (HOSPITAL_COMMUNITY): Payer: Medicare Other

## 2012-07-30 ENCOUNTER — Encounter (HOSPITAL_COMMUNITY): Payer: Self-pay | Admitting: General Practice

## 2012-07-30 LAB — COMPREHENSIVE METABOLIC PANEL
AST: 90 U/L — ABNORMAL HIGH (ref 0–37)
BUN: 18 mg/dL (ref 6–23)
CO2: 30 mEq/L (ref 19–32)
Chloride: 102 mEq/L (ref 96–112)
Creatinine, Ser: 0.72 mg/dL (ref 0.50–1.10)
GFR calc Af Amer: >90 mL/min (ref >90)

## 2012-07-30 LAB — CBC
HCT: 36.9 % (ref 36.0–46.0)
Hemoglobin: 12.1 g/dL (ref 12.0–15.0)
MCV: 98.4 fL (ref 78.0–100.0)
Platelets: 269 10*3/uL (ref 150–400)
RBC: 3.75 MIL/uL — ABNORMAL LOW (ref 3.87–5.11)
WBC: 8.3 10*3/uL (ref 4.0–10.5)

## 2012-07-30 LAB — LIPASE, BLOOD: Lipase: 31 U/L (ref 11–59)

## 2012-07-30 MED ORDER — HEPARIN SODIUM (PORCINE) 5000 UNIT/ML IJ SOLN
5000.0000 [IU] | Freq: Three times a day (TID) | INTRAMUSCULAR | Status: DC
  Administered 2012-07-30 – 2012-07-31 (×2): 5000 [IU] via SUBCUTANEOUS
  Filled 2012-07-30 (×5): qty 1

## 2012-07-30 MED ORDER — OXYCODONE HCL 5 MG PO TABS
5.0000 mg | ORAL_TABLET | ORAL | Status: DC | PRN
  Administered 2012-07-31 (×2): 5 mg via ORAL
  Filled 2012-07-30 (×2): qty 1

## 2012-07-30 MED ORDER — TECHNETIUM TC 99M MEBROFENIN IV KIT
5.0000 | PACK | Freq: Once | INTRAVENOUS | Status: AC | PRN
  Administered 2012-07-30: 5 via INTRAVENOUS

## 2012-07-30 MED ORDER — HYDROMORPHONE HCL PF 1 MG/ML IJ SOLN
1.0000 mg | INTRAMUSCULAR | Status: DC | PRN
  Administered 2012-07-31: 1 mg via INTRAVENOUS
  Filled 2012-07-30: qty 1

## 2012-07-30 NOTE — Progress Notes (Signed)
1 Day Post-Op   Assessment: s/p Procedure(s): LAPAROSCOPIC CHOLECYSTECTOMY WITH INTRAOPERATIVE CHOLANGIOGRAM Patient Active Problem List   Diagnosis Date Noted  . Gallstones 05/29/2012    More pain than usual post lap chole, uncertain if represents bile leak or other problem Slt elevation of liver enzymes, normal bili, may just be post op  Plan: Will get HB scan today to be sure this isn't a bile leak  Subjective: Had more pain than usual, not nauseated, has voided and taken some PO's. Most of pain is right sided and around umbilical incision  Objective: Vital signs in last 24 hours: Temp:  [97.6 F (36.4 C)-99.1 F (37.3 C)] 98.4 F (36.9 C) (07/03 0535) Pulse Rate:  [66-106] 66 (07/03 0535) Resp:  [6-18] 16 (07/03 0535) BP: (97-170)/(54-90) 99/54 mmHg (07/03 0535) SpO2:  [90 %-100 %] 100 % (07/03 0535)   Intake/Output from previous day: 07/02 0701 - 07/03 0700 In: 2320 [I.V.:2320] Out: 25 [Blood:25]  General appearance: alert, cooperative and mild distress Resp: clear to auscultation bilaterally GI: Not distended, fairly soft on left, but a little less so on right. Some right tenderness and quite tender at umbilical incison. Left not tender. No reboud, a few BS present  Incision: healing well  Lab Results:   Recent Labs  07/30/12 0450  WBC 8.3  HGB 12.1  HCT 36.9  PLT 269   BMET  Recent Labs  07/30/12 0450  NA 140  K 3.8  CL 102  CO2 30  GLUCOSE 115*  BUN 18  CREATININE 0.72  CALCIUM 8.7    MEDS, Scheduled . sodium chloride  3 mL Intravenous Q12H    Studies/Results: Dg Chest 2 View  07/29/2012   *RADIOLOGY REPORT*  Clinical Data: Hypertension.  Preop radiograph.  CHEST - 2 VIEW  Comparison: None  Findings: The heart size and mediastinal contours are within normal limits.  Both lungs are clear.  The visualized skeletal structures are unremarkable.  IMPRESSION: Negative exam.   Original Report Authenticated By: Signa Kell, M.D.   Dg  Cholangiogram Operative  07/29/2012   *RADIOLOGY REPORT*  Clinical Data: Gallstones  INTRAOPERATIVE CHOLANGIOGRAM  Technique:  Multiple fluoroscopic spot radiographs were obtained during intraoperative cholangiogram and are submitted for interpretation post-operatively.  Comparison: None.  Findings: C-arm films demonstrate that the gallbladder has been removed and the cystic duct has been cannulated. There is faint opacification of the biliary tree with poor demonstration of the distal common bile duct, and ampulla.  Clear-cut documentation of contrast passage into the duodenum is not established on these images.  IMPRESSION: No filling defects are seen in the biliary tree nor is there significant biliary dilatation.  Clear-cut visualization of the distal CBD and passage of contrast into the duodenum is not established with these images.   Original Report Authenticated By: Davonna Belling, M.D.      LOS: 1 day     Currie Paris, MD, [facility], [place] [phone]   07/30/2012 8:18 AM

## 2012-07-30 NOTE — Progress Notes (Signed)
At 1530 Prior RN offered patient OXY IR 10 mg PO to help pain, but patient refused, Patient wanted IV Dilaudid 2 mg. Gave patient IV dilaudid 2 mg for pain rated a 20 out of 10 when asked, stated she was behind with her last pain medicine that was given. Explained to patient about non-pharmacological ways to manage pain, patient verbalized understanding of the information. Patient and husband anticipating potential discharge this evening.

## 2012-07-30 NOTE — Progress Notes (Addendum)
1 Day Post-Op   Assessment: s/p Procedure(s): LAPAROSCOPIC CHOLECYSTECTOMY WITH INTRAOPERATIVE CHOLANGIOGRAM Patient Active Problem List   Diagnosis Date Noted  . Gallstones 05/29/2012    Still with pain somewhat out of proportion to the usual post lap chole pain  Plan: Will try to manage on po meds tonight and repeat labs in the am. If she does not improve she will need a CT scanWill reduce Dilaudid to 1 mg as I thinkshe had some respiratory depression from that as well as slowed mentation. Discussed plans with patient and her husband. He wants her to go home, but told them I think we need to be sure there is not an intra-abd problem  Subjective: Still having pain, primarily on right abdomen. Didn't get pain meds while off the floor for HIDA scan and now less pain since her pain med. She is not nauseated and wants to go home (husband also wants her to go home) She thinks because she has fibromyalgia the pain is worse than the usual patient would have.   Objective: Vital signs in last 24 hours: Temp:  [98.2 F (36.8 C)-99.1 F (37.3 C)] 98.3 F (36.8 C) (07/03 1033) Pulse Rate:  [66-93] 71 (07/03 1033) Resp:  [16-19] 19 (07/03 1033) BP: (97-112)/(54-67) 108/62 mmHg (07/03 1033) SpO2:  [93 %-100 %] 97 % (07/03 1033)   Intake/Output from previous day: 07/02 0701 - 07/03 0700 In: 2320 [I.V.:2320] Out: 25 [Blood:25]  General appearance: alert, cooperative, mild distress, slowed mentation and I think mentation slow due to recent (20 mintues) dose of IV pain med.  Resp: clear to auscultation bilaterally and has had low O2 sats, but comes up readily with deep breaths GI: Still a little tender on Right , no rebound not distended soft on left a few BS present   Incision: healing well, slighlty tender incisions  Lab Results:   Recent Labs  07/30/12 0450  WBC 8.3  HGB 12.1  HCT 36.9  PLT 269   BMET  Recent Labs  07/30/12 0450  NA 140  K 3.8  CL 102  CO2 30  GLUCOSE  115*  BUN 18  CREATININE 0.72  CALCIUM 8.7    MEDS, Scheduled . sodium chloride  3 mL Intravenous Q12H    Studies/Results: Dg Chest 2 View  07/29/2012   *RADIOLOGY REPORT*  Clinical Data: Hypertension.  Preop radiograph.  CHEST - 2 VIEW  Comparison: None  Findings: The heart size and mediastinal contours are within normal limits.  Both lungs are clear.  The visualized skeletal structures are unremarkable.  IMPRESSION: Negative exam.   Original Report Authenticated By: Signa Kell, M.D.   Dg Cholangiogram Operative  07/29/2012   *RADIOLOGY REPORT*  Clinical Data: Gallstones  INTRAOPERATIVE CHOLANGIOGRAM  Technique:  Multiple fluoroscopic spot radiographs were obtained during intraoperative cholangiogram and are submitted for interpretation post-operatively.  Comparison: None.  Findings: C-arm films demonstrate that the gallbladder has been removed and the cystic duct has been cannulated. There is faint opacification of the biliary tree with poor demonstration of the distal common bile duct, and ampulla.  Clear-cut documentation of contrast passage into the duodenum is not established on these images.  IMPRESSION: No filling defects are seen in the biliary tree nor is there significant biliary dilatation.  Clear-cut visualization of the distal CBD and passage of contrast into the duodenum is not established with these images.   Original Report Authenticated By: Davonna Belling, M.D.   Nm Hepatobiliary  07/30/2012   *RADIOLOGY REPORT*  Clinical Data: Right upper quadrant pain, post cholecystectomy  NUCLEAR MEDICINE HEPATOHBILIARY INCLUDE GB  Radiopharmaceutical:  5.0 mCi Tc 33m Choletec  Comparison: None.  Findings: There is normal uptake of the tracer by the liver.  CBD and duodenum are visualized at 16 minutes.  There is no evidence of biliary leak.  At 60 minutes there is some clearing of activity from the liver.  Increasing bowel activity noted in the mid bowel. At 120 minutes there is almost complete  clearing of activity from the liver, CBD and duodenum.  No evidence of biliary leak. Activity noted only in distal small bowel.  IMPRESSION: Normal hepatobiliary scan.  No evidence of biliary leak.   Original Report Authenticated By: Natasha Mead, M.D.      LOS: 1 day     Currie Paris, MD, [facility], [place] [phone]   07/30/2012 7:07 PM

## 2012-07-31 LAB — CBC
HCT: 36.5 % (ref 36.0–46.0)
Hemoglobin: 12 g/dL (ref 12.0–15.0)
MCHC: 32.9 g/dL (ref 30.0–36.0)
RBC: 3.69 MIL/uL — ABNORMAL LOW (ref 3.87–5.11)

## 2012-07-31 LAB — COMPREHENSIVE METABOLIC PANEL
ALT: 55 U/L — ABNORMAL HIGH (ref 0–35)
Alkaline Phosphatase: 25 U/L — ABNORMAL LOW (ref 39–117)
BUN: 19 mg/dL (ref 6–23)
CO2: 30 mEq/L (ref 19–32)
GFR calc Af Amer: >90 mL/min (ref >90)
GFR calc non Af Amer: >90 mL/min (ref >90)
Glucose, Bld: 134 mg/dL — ABNORMAL HIGH (ref 70–99)
Potassium: 3.5 mEq/L (ref 3.5–5.1)
Sodium: 143 mEq/L (ref 135–145)
Total Bilirubin: 0.3 mg/dL (ref 0.3–1.2)

## 2012-07-31 MED ORDER — OXYCODONE HCL 5 MG PO TABS
5.0000 mg | ORAL_TABLET | ORAL | Status: DC | PRN
Start: 2012-07-31 — End: 2012-08-13

## 2012-07-31 NOTE — Progress Notes (Signed)
2 Days Post-Op  Subjective: Feeling better.  Has a headache.  Objective: Vital signs in last 24 hours: Temp:  [98.5 F (36.9 C)-100.2 F (37.9 C)] 98.8 F (37.1 C) (07/04 1032) Pulse Rate:  [84-107] 84 (07/04 1032) Resp:  [17-18] 17 (07/04 1032) BP: (120-138)/(58-91) 124/73 mmHg (07/04 1032) SpO2:  [90 %-95 %] 92 % (07/04 1032) Last BM Date: 07/28/12  Intake/Output from previous day:   Intake/Output this shift: Total I/O In: 240 [P.O.:240] Out: -   PE: General- In NAD Abdomen-soft, incisions are clean and intact  Lab Results:   Recent Labs  07/30/12 0450 07/31/12 0600  WBC 8.3 8.3  HGB 12.1 12.0  HCT 36.9 36.5  PLT 269 238   BMET  Recent Labs  07/30/12 0450 07/31/12 0600  NA 140 143  K 3.8 3.5  CL 102 103  CO2 30 30  GLUCOSE 115* 134*  BUN 18 19  CREATININE 0.72 0.59  CALCIUM 8.7 8.5   PT/INR No results found for this basename: LABPROT, INR,  in the last 72 hours Comprehensive Metabolic Panel:    Component Value Date/Time   NA 143 07/31/2012 0600   K 3.5 07/31/2012 0600   CL 103 07/31/2012 0600   CO2 30 07/31/2012 0600   BUN 19 07/31/2012 0600   CREATININE 0.59 07/31/2012 0600   GLUCOSE 134* 07/31/2012 0600   CALCIUM 8.5 07/31/2012 0600   AST 64* 07/31/2012 0600   ALT 55* 07/31/2012 0600   ALKPHOS 25* 07/31/2012 0600   BILITOT 0.3 07/31/2012 0600   PROT 6.5 07/31/2012 0600   ALBUMIN 3.5 07/31/2012 0600     Studies/Results: Dg Cholangiogram Operative  07/29/2012   *RADIOLOGY REPORT*  Clinical Data: Gallstones  INTRAOPERATIVE CHOLANGIOGRAM  Technique:  Multiple fluoroscopic spot radiographs were obtained during intraoperative cholangiogram and are submitted for interpretation post-operatively.  Comparison: None.  Findings: C-arm films demonstrate that the gallbladder has been removed and the cystic duct has been cannulated. There is faint opacification of the biliary tree with poor demonstration of the distal common bile duct, and ampulla.  Clear-cut documentation of  contrast passage into the duodenum is not established on these images.  IMPRESSION: No filling defects are seen in the biliary tree nor is there significant biliary dilatation.  Clear-cut visualization of the distal CBD and passage of contrast into the duodenum is not established with these images.   Original Report Authenticated By: Davonna Belling, M.D.   Nm Hepatobiliary  07/30/2012   *RADIOLOGY REPORT*  Clinical Data: Right upper quadrant pain, post cholecystectomy  NUCLEAR MEDICINE HEPATOHBILIARY INCLUDE GB  Radiopharmaceutical:  5.0 mCi Tc 11m Choletec  Comparison: None.  Findings: There is normal uptake of the tracer by the liver.  CBD and duodenum are visualized at 16 minutes.  There is no evidence of biliary leak.  At 60 minutes there is some clearing of activity from the liver.  Increasing bowel activity noted in the mid bowel. At 120 minutes there is almost complete clearing of activity from the liver, CBD and duodenum.  No evidence of biliary leak. Activity noted only in distal small bowel.  IMPRESSION: Normal hepatobiliary scan.  No evidence of biliary leak.   Original Report Authenticated By: Natasha Mead, M.D.    Anti-infectives: Anti-infectives   Start     Dose/Rate Route Frequency Ordered Stop   07/29/12 0600  ceFAZolin (ANCEF) IVPB 2 g/50 mL premix     2 g 100 mL/hr over 30 Minutes Intravenous On call to O.R. 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  Instructions given.   Suzanne [name] J 07/31/2012

## 2012-07-31 NOTE — Progress Notes (Signed)
Discharge Note. Pt and pt's husband present for discharge instructions. Medications were reviewed, Rx given. Signs and symptoms of when to call the doctor were reviewed. Discharge education complete.

## 2012-08-04 NOTE — Discharge Summary (Signed)
Physician Discharge Summary  Patient ID: Suzanne [name] MRN: [id number] DOB/AGE: 61/08/1950 61 y.o.  Admit date: 07/29/2012 Discharge date: 07/31/2012  Admission Diagnoses:  Chronic calculous cholecystitis  Discharge Diagnoses:   Same   Discharged Condition: good  Hospital Course: She underwent laparoscopic cholecystectomy with cholangiogram 07/29/2012. She has some significant postoperative pain. She underwent an evaluation to rule out a bile leak. Nuclear medicine hepatobiliary study was negative for this. She did feel significantly better by her second postoperative day and was able to be discharged. Discharge instructions were given to her.  Consults: None  Significant Diagnostic Studies: nuclear medicine: Hepatobiliary study  Treatments: surgery: Laparoscopic cholecystectomy with cholangiogram  Discharge Exam: Blood pressure 124/73, pulse 84, temperature 98.8 F (37.1 C), temperature source Oral, resp. rate 17, SpO2 92.00%.   Disposition: 01-Home or Self Care   Future Appointments Provider Department Dept Phone   08/13/2012 1:30 PM Currie Paris, MD [facility], [place] [phone]       Medication List         acetaminophen 500 MG tablet  Commonly known as:  TYLENOL  Take 1,500 mg by mouth every 6 (six) hours as needed for pain.     ALLERGY RELIEF D PO  Take 1 tablet by mouth daily.     clonazePAM 0.5 MG tablet  Commonly known as:  KLONOPIN  Take 0.5 mg by mouth 2 (two) times daily as needed for anxiety.     CO Q 10 PO  Take 1 capsule by mouth daily.     fenofibrate 160 MG tablet  Take 160 mg by mouth daily.     fluticasone 50 MCG/ACT nasal spray  Commonly known as:  FLONASE  Place 2 sprays into the nose daily.     gabapentin 800 MG tablet  Commonly known as:  NEURONTIN  Take 800 mg by mouth 3 (three) times daily.     meclizine 25 MG tablet  Commonly known as:  ANTIVERT  Take 25 mg by mouth 3 (three) times daily as needed.     oxyCODONE 5 MG immediate release tablet  Commonly known as:  Oxy IR/ROXICODONE  Take 1 tablet (5 mg total) by mouth every 4 (four) hours as needed.     oxyCODONE-acetaminophen 5-325 MG per tablet  Commonly known as:  ROXICET  Take 1 tablet by mouth every 4 (four) hours as needed for pain.     pravastatin 80 MG tablet  Commonly known as:  PRAVACHOL  Take 80 mg by mouth daily.     triamterene-hydrochlorothiazide 37.5-25 MG per tablet  Commonly known as:  MAXZIDE-25  Take 1 tablet by mouth daily.     venlafaxine XR 150 MG 24 hr capsule  Commonly known as:  EFFEXOR-XR  Take 150 mg by mouth 2 (two) times daily.           Follow-up Information   Follow up with Currie Paris, MD In 2 weeks.   Contact information:   [address] [place] [address] [phone]       Signed: Adolph Pollack 08/04/2012, 1:34 PM

## 2012-08-13 ENCOUNTER — Encounter (INDEPENDENT_AMBULATORY_CARE_PROVIDER_SITE_OTHER): Payer: Self-pay | Admitting: Surgery

## 2012-08-13 ENCOUNTER — Ambulatory Visit (INDEPENDENT_AMBULATORY_CARE_PROVIDER_SITE_OTHER): Payer: Medicare Other | Admitting: Surgery

## 2012-08-13 VITALS — BP 120/80 | HR 88 | Resp 16 | Ht 66.0 in | Wt 193.0 lb

## 2012-08-13 DIAGNOSIS — Z09 Encounter for follow-up examination after completed treatment for conditions other than malignant neoplasm: Secondary | ICD-10-CM

## 2012-08-13 DIAGNOSIS — K8020 Calculus of gallbladder without cholecystitis without obstruction: Secondary | ICD-10-CM

## 2012-08-13 NOTE — Progress Notes (Signed)
NAME: Suzanne [name]       DOB: 1950/07/25           DATE: 08/13/2012       ZOX:[id number]   CC:  Chief Complaint  Patient presents with  . Routine Post Op    lap chole 07/29/2012     Impression:  The patient appears to be doing well, with improvement in her symptoms.  Plan:  She may resume full activity and regular diet. She  will followup with [place] on a p.r.n. basis. I did tell her that she may still have some foods that cause indigestion and ask her to call us if there are any questions, problems or concerns.  HPI:  This patient underwent a laparoscopic cholecystectomy with operative cholangiogram on 7/2. She is in for her first postoperative visit. She notes that her incisional pain has resolved. Her preoperative symptoms have improved. She is not having problems with nausea, vomiting, diarrhea, fevers, chills, or urinary symptoms. She is tolerating diet. She feels that she is progressing well and nearly back to normal.She spent an extra night inm the hospital due to a fair amount of post op pain, but that has resolved. A HIDA scan was negative for leak or obstruction of the CBD PE:  VS: BP 120/80  Pulse 88  Resp 16  Ht 5\' 6"  (1.676 m)  Wt 193 lb (87.544 kg)  BMI 31.17 kg/m2  General: The patient is alert and appears comfortable, NAD.  Abdomen: Soft and benign. The incisions are healing nicely. There are no apparent problems.  Data reviewed: WJX:[id number]: C-arm films demonstrate that the gallbladder has been  removed and the cystic duct has been cannulated. There is faint  opacification of the biliary tree with poor demonstration of the  distal common bile duct, and ampulla. Clear-cut documentation of  contrast passage into the duodenum is not established on these  images.  IMPRESSION:  No filling defects are seen in the biliary tree nor is there  significant biliary dilatation. Clear-cut visualization of the  distal CBD and passage of contrast into the duodenum is not    HIDA:  Findings: There is normal uptake of the tracer by the liver. CBD  and duodenum are visualized at 16 minutes. There is no evidence of  biliary leak. At 60 minutes there is some clearing of activity  from the liver. Increasing bowel activity noted in the mid bowel.  At 120 minutes there is almost complete clearing of activity from  the liver, CBD and duodenum. No evidence of biliary leak.  Activity noted only in distal small bowel.  IMPRESSION:  Normal hepatobiliary scan. No evidence of biliary leak.   established with these images.  Pathology: Diagnosis Gallbladder - CHRONIC CHOLECYSTITIS AND CHOLELITHIASIS. Abigail Miyamoto MD Pathologist, Electronic Signature (Case signed 07/30/2012)

## 2012-08-13 NOTE — Patient Instructions (Signed)
We will see you again on an as needed basis. Please call the office at [phone] if you have any questions or concerns. Thank you for allowing us to take care of you.  

## 2012-10-26 ENCOUNTER — Encounter (INDEPENDENT_AMBULATORY_CARE_PROVIDER_SITE_OTHER): Payer: Self-pay

## 2014-06-14 ENCOUNTER — Other Ambulatory Visit: Payer: Self-pay | Admitting: Internal Medicine

## 2014-06-14 DIAGNOSIS — Z1231 Encounter for screening mammogram for malignant neoplasm of breast: Secondary | ICD-10-CM

## 2014-06-24 ENCOUNTER — Other Ambulatory Visit: Payer: Self-pay | Admitting: Internal Medicine

## 2014-06-24 ENCOUNTER — Ambulatory Visit
Admission: RE | Admit: 2014-06-24 | Discharge: 2014-06-24 | Disposition: A | Discharge status: Home / Self Care | Payer: Medicare Other | Source: Ambulatory Visit | Attending: Internal Medicine | Admitting: Internal Medicine

## 2014-06-24 DIAGNOSIS — Z1231 Encounter for screening mammogram for malignant neoplasm of breast: Secondary | ICD-10-CM

## 2015-12-20 ENCOUNTER — Other Ambulatory Visit: Payer: Self-pay | Admitting: Gastroenterology

## 2016-02-20 ENCOUNTER — Encounter (HOSPITAL_COMMUNITY): Payer: Self-pay

## 2016-02-20 ENCOUNTER — Ambulatory Visit (HOSPITAL_COMMUNITY): Admit: 2016-02-20 | Payer: Medicare Other | Admitting: Gastroenterology

## 2016-02-20 SURGERY — COLONOSCOPY WITH PROPOFOL
Anesthesia: Monitor Anesthesia Care

## 2016-03-26 ENCOUNTER — Other Ambulatory Visit: Payer: Self-pay | Admitting: Internal Medicine

## 2016-03-26 DIAGNOSIS — Z1231 Encounter for screening mammogram for malignant neoplasm of breast: Secondary | ICD-10-CM

## 2016-05-20 ENCOUNTER — Ambulatory Visit
Admission: RE | Admit: 2016-05-20 | Discharge: 2016-05-20 | Disposition: A | Discharge status: Home / Self Care | Payer: Medicare Other | Source: Ambulatory Visit | Attending: Internal Medicine | Admitting: Internal Medicine

## 2016-05-20 DIAGNOSIS — Z1231 Encounter for screening mammogram for malignant neoplasm of breast: Secondary | ICD-10-CM | POA: Insufficient documentation

## 2016-05-28 ENCOUNTER — Other Ambulatory Visit: Payer: Self-pay | Admitting: Gastroenterology

## 2016-06-17 ENCOUNTER — Encounter (HOSPITAL_COMMUNITY): Payer: Self-pay | Admitting: *Deleted

## 2016-06-18 ENCOUNTER — Ambulatory Visit (HOSPITAL_COMMUNITY)
Admission: RE | Admit: 2016-06-18 | Discharge: 2016-06-18 | Disposition: A | Discharge status: Home / Self Care | Payer: Medicare Other | Source: Ambulatory Visit | Attending: Gastroenterology | Admitting: Gastroenterology

## 2016-06-18 ENCOUNTER — Encounter (HOSPITAL_COMMUNITY): Payer: Self-pay | Admitting: *Deleted

## 2016-06-18 ENCOUNTER — Ambulatory Visit (HOSPITAL_COMMUNITY): Payer: Medicare Other | Admitting: Certified Registered Nurse Anesthetist

## 2016-06-18 ENCOUNTER — Encounter (HOSPITAL_COMMUNITY)
Admission: RE | Disposition: A | Discharge status: Home / Self Care | Payer: Self-pay | Source: Ambulatory Visit | Attending: Gastroenterology

## 2016-06-18 DIAGNOSIS — F329 Major depressive disorder, single episode, unspecified: Secondary | ICD-10-CM | POA: Diagnosis not present

## 2016-06-18 DIAGNOSIS — Z79899 Other long term (current) drug therapy: Secondary | ICD-10-CM | POA: Diagnosis not present

## 2016-06-18 DIAGNOSIS — I10 Essential (primary) hypertension: Secondary | ICD-10-CM | POA: Diagnosis not present

## 2016-06-18 DIAGNOSIS — E7800 Pure hypercholesterolemia, unspecified: Secondary | ICD-10-CM | POA: Diagnosis not present

## 2016-06-18 DIAGNOSIS — F419 Anxiety disorder, unspecified: Secondary | ICD-10-CM | POA: Insufficient documentation

## 2016-06-18 DIAGNOSIS — Z1211 Encounter for screening for malignant neoplasm of colon: Secondary | ICD-10-CM | POA: Diagnosis present

## 2016-06-18 DIAGNOSIS — Z85040 Personal history of malignant carcinoid tumor of rectum: Secondary | ICD-10-CM | POA: Insufficient documentation

## 2016-06-18 HISTORY — PX: COLONOSCOPY WITH PROPOFOL: SHX5780

## 2016-06-18 SURGERY — COLONOSCOPY WITH PROPOFOL
Anesthesia: Monitor Anesthesia Care

## 2016-06-18 MED ORDER — PROPOFOL 500 MG/50ML IV EMUL
INTRAVENOUS | Status: DC | PRN
  Administered 2016-06-18: 200 ug/kg/min via INTRAVENOUS

## 2016-06-18 MED ORDER — SODIUM CHLORIDE 0.9 % IV SOLN: INTRAVENOUS | Status: DC

## 2016-06-18 MED ORDER — ONDANSETRON HCL 4 MG/2ML IJ SOLN
INTRAMUSCULAR | Status: AC
  Filled 2016-06-18: qty 2

## 2016-06-18 MED ORDER — PROPOFOL 10 MG/ML IV BOLUS
INTRAVENOUS | Status: DC | PRN
  Administered 2016-06-18: 10 mg via INTRAVENOUS

## 2016-06-18 MED ORDER — LACTATED RINGERS IV SOLN
INTRAVENOUS | Status: DC
  Administered 2016-06-18: 07:00:00 via INTRAVENOUS

## 2016-06-18 MED ORDER — PROPOFOL 10 MG/ML IV BOLUS
INTRAVENOUS | Status: AC
  Filled 2016-06-18: qty 40

## 2016-06-18 MED ORDER — ONDANSETRON HCL 4 MG/2ML IJ SOLN
INTRAMUSCULAR | Status: DC | PRN
  Administered 2016-06-18: 4 mg via INTRAVENOUS

## 2016-06-18 SURGICAL SUPPLY — 21 items

## 2016-06-18 NOTE — Op Note (Signed)
[facility] Patient Name: Suzanne [name] Procedure Date: 06/18/2016 MRN: [id number] Attending MD: Garlan Fair , MD Date of Birth: 1950/05/20 CSN: [id number] Age: 66 Admit Type: Outpatient Procedure:                Colonoscopy Indications:              Screening for colorectal malignant neoplasm. 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                            Normal screening colonoscopies were performed in                            April 2007 and November 2012 Providers:                Garlan Fair, MD, Cleda Daub, RN, Tinnie Gens, Technician Referring MD:              Medicines:                Propofol per Anesthesia Complications:            No immediate complications. Estimated Blood Loss:     Estimated blood loss: none. Procedure:                Pre-Anesthesia Assessment:                           - Prior to the procedure, a History and Physical                            was performed, and patient medications and                            allergies were reviewed. The patient's tolerance of                            previous anesthesia was also reviewed. The risks                            and benefits of the procedure and the sedation                            options and risks were discussed with the patient.                            All questions were answered, and informed consent                            was obtained. Prior Anticoagulants: The patient has                            taken  no previous anticoagulant or antiplatelet                            agents. ASA Grade Assessment: II - A patient with                            mild systemic disease. After reviewing the risks                            and benefits, the patient was deemed in                            satisfactory condition to undergo the procedure.                           After obtaining  informed consent, the colonoscope                            was passed under direct vision. Throughout the                            procedure, the patient's blood pressure, pulse, and                            oxygen saturations were monitored continuously. The                            EC-[id number] ([id number]) scope was introduced through                            the anus and advanced to the the cecum, identified                            by appendiceal orifice and ileocecal valve. The                            colonoscopy was performed without difficulty. The                            patient tolerated the procedure well. The quality                            of the bowel preparation was good. The appendiceal                            orifice and the rectum were photographed. Scope In: 4:58:09 AM Scope Out: 8:13:52 AM Scope Withdrawal Time: 0 hours 5 minutes 24 seconds  Total Procedure Duration: 0 hours 27 minutes 44 seconds  Findings:      The perianal and digital rectal examinations were normal.      The entire examined colon appeared normal. Impression:               - The entire examined colon is normal.                           -  No specimens collected. Moderate Sedation:      N/A- Per Anesthesia Care Recommendation:           - Patient has a contact number available for                            emergencies. The signs and symptoms of potential                            delayed complications were discussed with the                            patient. Return to normal activities tomorrow.                            Written discharge instructions were provided to the                            patient.                           - Repeat colonoscopy in 10 years for screening                            purposes.                           - Resume previous diet.                           - Continue present medications. Procedure Code(s):        --- Professional ---                            Z6109, Colorectal cancer screening; colonoscopy on                            individual not meeting criteria for high risk Diagnosis Code(s):        --- Professional ---                           Z12.11, Encounter for screening for malignant                            neoplasm of colon CPT copyright 2016 American Medical Association. All rights reserved. The codes documented in this report are preliminary and upon coder review may  be revised to meet current compliance requirements. Earle Gell, MD Garlan Fair, MD 06/18/2016 8:21:13 AM This report has been signed electronically. Number of Addenda: 0

## 2016-06-18 NOTE — H&P (Signed)
Procedure: Screening colonoscopy. Rectal carcinoid tumor removed surgically in 1989. Normal screening colonoscopies were performed in April 2007 and November 2012  History: The patient is a 66 year old female born 1950-04-24. She is scheduled to undergo a repeat screening colonoscopy today.  Medication allergies: Intravenous contrast causes rash  Past medical history: Total abdominal hysterectomy with BSO. 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 Hypertension. Hypercholesterolemia.  Exam: The patient is alert and lying comfortably on the endoscopy stretcher. Abdomen is soft and nontender to palpation. Lungs are clear to auscultation. Cardiac exam reveals a regular rhythm.  Plan: Proceed with screening colonoscopy

## 2016-06-18 NOTE — Anesthesia Postprocedure Evaluation (Signed)
Anesthesia Post Note  Patient: Suzanne [name]  Procedure(s) Performed: Procedure(s) (LRB): COLONOSCOPY WITH PROPOFOL (N/A)  Patient location during evaluation: PACU Anesthesia Type: MAC Level of consciousness: awake and alert Pain management: pain level controlled Vital Signs Assessment: post-procedure vital signs reviewed and stable Respiratory status: spontaneous breathing, nonlabored ventilation, respiratory function stable and patient connected to nasal cannula oxygen Cardiovascular status: stable and blood pressure returned to baseline Anesthetic complications: no       Last Vitals:  Vitals:   06/18/16 0650 06/18/16 0820  BP: (!) 149/70 132/67  Pulse: 80   Resp: 15 13  Temp: 36.9 C 36.7 C    Last Pain:  Vitals:   06/18/16 0820  TempSrc: Oral                 [name]

## 2016-06-18 NOTE — Anesthesia Preprocedure Evaluation (Signed)
Anesthesia Evaluation  Patient identified by MRN, date of birth, ID band Patient awake    Reviewed: Allergy & Precautions, NPO status , Patient's Chart, lab work & pertinent test results  Airway Mallampati: II  TM Distance: >3 FB Neck ROM: Full    Dental no notable dental hx.    Pulmonary neg pulmonary ROS,    Pulmonary exam normal breath sounds clear to auscultation       Cardiovascular hypertension, Normal cardiovascular exam Rhythm:Regular Rate:Normal     Neuro/Psych negative neurological ROS  negative psych ROS   GI/Hepatic negative GI ROS, Neg liver ROS,   Endo/Other  negative endocrine ROS  Renal/GU negative Renal ROS  negative genitourinary   Musculoskeletal negative musculoskeletal ROS (+)   Abdominal   Peds negative pediatric ROS (+)  Hematology negative hematology ROS (+)   Anesthesia Other Findings   Reproductive/Obstetrics negative OB ROS                             Anesthesia Physical Anesthesia Plan  ASA: II  Anesthesia Plan: MAC   Post-op Pain Management:    Induction: Intravenous  Airway Management Planned: Simple Face Mask  Additional Equipment:   Intra-op Plan:   Post-operative Plan:   Informed Consent: I have reviewed the patients History and Physical, chart, labs and discussed the procedure including the risks, benefits and alternatives for the proposed anesthesia with the patient or authorized representative who has indicated his/her understanding and acceptance.   Dental advisory given  Plan Discussed with: CRNA and Surgeon  Anesthesia Plan Comments:         Anesthesia Quick Evaluation

## 2016-06-18 NOTE — Transfer of Care (Signed)
Immediate Anesthesia Transfer of Care Note  Patient: Suzanne [name]  Procedure(s) Performed: Procedure(s): COLONOSCOPY WITH PROPOFOL (N/A)  Patient Location: PACU  Anesthesia Type:MAC  Level of Consciousness:  sedated, patient cooperative and responds to stimulation  Airway & Oxygen Therapy:Patient Spontanous Breathing and Patient connected to face mask oxgen  Post-op Assessment:  Report given to PACU RN and Post -op Vital signs reviewed and stable  Post vital signs:  Reviewed and stable  Last Vitals:  Vitals:   06/18/16 0650  BP: (!) 149/70  Pulse: 80  Resp: 15  Temp: 29.5 C    Complications: No apparent anesthesia complications

## 2016-06-18 NOTE — Discharge Instructions (Signed)

## 2016-07-01 NOTE — Anesthesia Postprocedure Evaluation (Signed)
Anesthesia Post Note  Patient: Suzanne [name]  Procedure(s) Performed: Procedure(s) (LRB): COLONOSCOPY WITH PROPOFOL (N/A)     Anesthesia Post Evaluation  Last Vitals:  Vitals:   06/18/16 0820 06/18/16 0835  BP: 132/67 125/79  Pulse:    Resp: 13 13  Temp: 36.7 C     Last Pain:  Vitals:   06/18/16 0820  TempSrc: Oral                 [name]

## 2016-07-01 NOTE — Addendum Note (Signed)
Addendum  created 07/01/16 1442 by Myrtie Soman, MD   Sign clinical note

## 2017-03-03 DIAGNOSIS — E785 Hyperlipidemia, unspecified: Secondary | ICD-10-CM | POA: Diagnosis not present

## 2017-03-03 DIAGNOSIS — Z1389 Encounter for screening for other disorder: Secondary | ICD-10-CM | POA: Diagnosis not present

## 2017-03-03 DIAGNOSIS — E663 Overweight: Secondary | ICD-10-CM | POA: Diagnosis not present

## 2017-03-03 DIAGNOSIS — M797 Fibromyalgia: Secondary | ICD-10-CM | POA: Diagnosis not present

## 2017-03-03 DIAGNOSIS — I10 Essential (primary) hypertension: Secondary | ICD-10-CM | POA: Diagnosis not present

## 2017-03-03 DIAGNOSIS — F418 Other specified anxiety disorders: Secondary | ICD-10-CM | POA: Diagnosis not present

## 2017-03-03 DIAGNOSIS — K760 Fatty (change of) liver, not elsewhere classified: Secondary | ICD-10-CM | POA: Diagnosis not present

## 2017-03-03 DIAGNOSIS — L301 Dyshidrosis [pompholyx]: Secondary | ICD-10-CM | POA: Diagnosis not present

## 2017-04-18 DIAGNOSIS — H25093 Other age-related incipient cataract, bilateral: Secondary | ICD-10-CM | POA: Diagnosis not present

## 2017-05-05 DIAGNOSIS — I10 Essential (primary) hypertension: Secondary | ICD-10-CM | POA: Diagnosis not present

## 2017-05-05 DIAGNOSIS — L301 Dyshidrosis [pompholyx]: Secondary | ICD-10-CM | POA: Diagnosis not present

## 2017-05-05 DIAGNOSIS — L299 Pruritus, unspecified: Secondary | ICD-10-CM | POA: Diagnosis not present

## 2017-05-05 DIAGNOSIS — F418 Other specified anxiety disorders: Secondary | ICD-10-CM | POA: Diagnosis not present

## 2017-05-05 DIAGNOSIS — Z0000 Encounter for general adult medical examination without abnormal findings: Secondary | ICD-10-CM | POA: Diagnosis not present

## 2017-05-06 ENCOUNTER — Other Ambulatory Visit: Payer: Self-pay | Admitting: Internal Medicine

## 2017-05-06 DIAGNOSIS — N951 Menopausal and female climacteric states: Secondary | ICD-10-CM

## 2017-05-06 DIAGNOSIS — Z1231 Encounter for screening mammogram for malignant neoplasm of breast: Secondary | ICD-10-CM

## 2017-05-19 DIAGNOSIS — C44319 Basal cell carcinoma of skin of other parts of face: Secondary | ICD-10-CM | POA: Diagnosis not present

## 2017-06-05 ENCOUNTER — Ambulatory Visit
Admission: RE | Admit: 2017-06-05 | Discharge: 2017-06-05 | Disposition: A | Discharge status: Home / Self Care | Payer: PPO | Source: Ambulatory Visit | Attending: Internal Medicine | Admitting: Internal Medicine

## 2017-06-05 DIAGNOSIS — N951 Menopausal and female climacteric states: Secondary | ICD-10-CM | POA: Diagnosis not present

## 2017-06-05 DIAGNOSIS — Z1231 Encounter for screening mammogram for malignant neoplasm of breast: Secondary | ICD-10-CM | POA: Diagnosis not present

## 2017-06-05 DIAGNOSIS — Z13820 Encounter for screening for osteoporosis: Secondary | ICD-10-CM | POA: Diagnosis not present

## 2017-07-24 DIAGNOSIS — L301 Dyshidrosis [pompholyx]: Secondary | ICD-10-CM | POA: Diagnosis not present

## 2017-08-06 DIAGNOSIS — F418 Other specified anxiety disorders: Secondary | ICD-10-CM | POA: Diagnosis not present

## 2017-08-06 DIAGNOSIS — M797 Fibromyalgia: Secondary | ICD-10-CM | POA: Diagnosis not present

## 2017-08-06 DIAGNOSIS — K5900 Constipation, unspecified: Secondary | ICD-10-CM | POA: Diagnosis not present

## 2017-08-06 DIAGNOSIS — K589 Irritable bowel syndrome without diarrhea: Secondary | ICD-10-CM | POA: Diagnosis not present

## 2017-08-20 DIAGNOSIS — F418 Other specified anxiety disorders: Secondary | ICD-10-CM | POA: Diagnosis not present

## 2017-09-26 DIAGNOSIS — I10 Essential (primary) hypertension: Secondary | ICD-10-CM | POA: Diagnosis not present

## 2017-09-26 DIAGNOSIS — F418 Other specified anxiety disorders: Secondary | ICD-10-CM | POA: Diagnosis not present

## 2017-10-20 DIAGNOSIS — Z0000 Encounter for general adult medical examination without abnormal findings: Secondary | ICD-10-CM | POA: Diagnosis not present

## 2017-11-04 DIAGNOSIS — L299 Pruritus, unspecified: Secondary | ICD-10-CM | POA: Diagnosis not present

## 2017-11-04 DIAGNOSIS — L280 Lichen simplex chronicus: Secondary | ICD-10-CM | POA: Diagnosis not present

## 2017-11-04 DIAGNOSIS — L209 Atopic dermatitis, unspecified: Secondary | ICD-10-CM | POA: Diagnosis not present

## 2017-11-04 DIAGNOSIS — R531 Weakness: Secondary | ICD-10-CM | POA: Diagnosis not present

## 2017-11-19 DIAGNOSIS — E785 Hyperlipidemia, unspecified: Secondary | ICD-10-CM | POA: Diagnosis not present

## 2017-11-19 DIAGNOSIS — R531 Weakness: Secondary | ICD-10-CM | POA: Diagnosis not present

## 2017-11-24 DIAGNOSIS — R350 Frequency of micturition: Secondary | ICD-10-CM | POA: Diagnosis not present

## 2017-11-24 DIAGNOSIS — F332 Major depressive disorder, recurrent severe without psychotic features: Secondary | ICD-10-CM | POA: Diagnosis not present

## 2017-11-24 DIAGNOSIS — F418 Other specified anxiety disorders: Secondary | ICD-10-CM | POA: Diagnosis not present

## 2017-11-24 DIAGNOSIS — N368 Other specified disorders of urethra: Secondary | ICD-10-CM | POA: Diagnosis not present

## 2017-11-24 DIAGNOSIS — Z23 Encounter for immunization: Secondary | ICD-10-CM | POA: Diagnosis not present

## 2017-11-25 DIAGNOSIS — N8189 Other female genital prolapse: Secondary | ICD-10-CM | POA: Diagnosis not present

## 2017-12-02 DIAGNOSIS — L209 Atopic dermatitis, unspecified: Secondary | ICD-10-CM | POA: Diagnosis not present

## 2017-12-02 DIAGNOSIS — L280 Lichen simplex chronicus: Secondary | ICD-10-CM | POA: Diagnosis not present

## 2017-12-24 DIAGNOSIS — I10 Essential (primary) hypertension: Secondary | ICD-10-CM | POA: Diagnosis not present

## 2017-12-24 DIAGNOSIS — F332 Major depressive disorder, recurrent severe without psychotic features: Secondary | ICD-10-CM | POA: Diagnosis not present

## 2018-01-11 DIAGNOSIS — J018 Other acute sinusitis: Secondary | ICD-10-CM | POA: Diagnosis not present

## 2018-01-26 DIAGNOSIS — R05 Cough: Secondary | ICD-10-CM | POA: Diagnosis not present

## 2018-01-26 DIAGNOSIS — R238 Other skin changes: Secondary | ICD-10-CM | POA: Diagnosis not present

## 2018-08-01 IMAGING — MG MM DIGITAL SCREENING BILAT W/ TOMO W/ CAD
6 of 10 series · 6 of 30 positions shown · non-contrast
Comparison: Previous exam(s).

CLINICAL DATA: Screening.

EXAM:
DIGITAL SCREENING BILATERAL MAMMOGRAM WITH TOMO AND CAD

[L MLO synth-2D (1 of 2)]
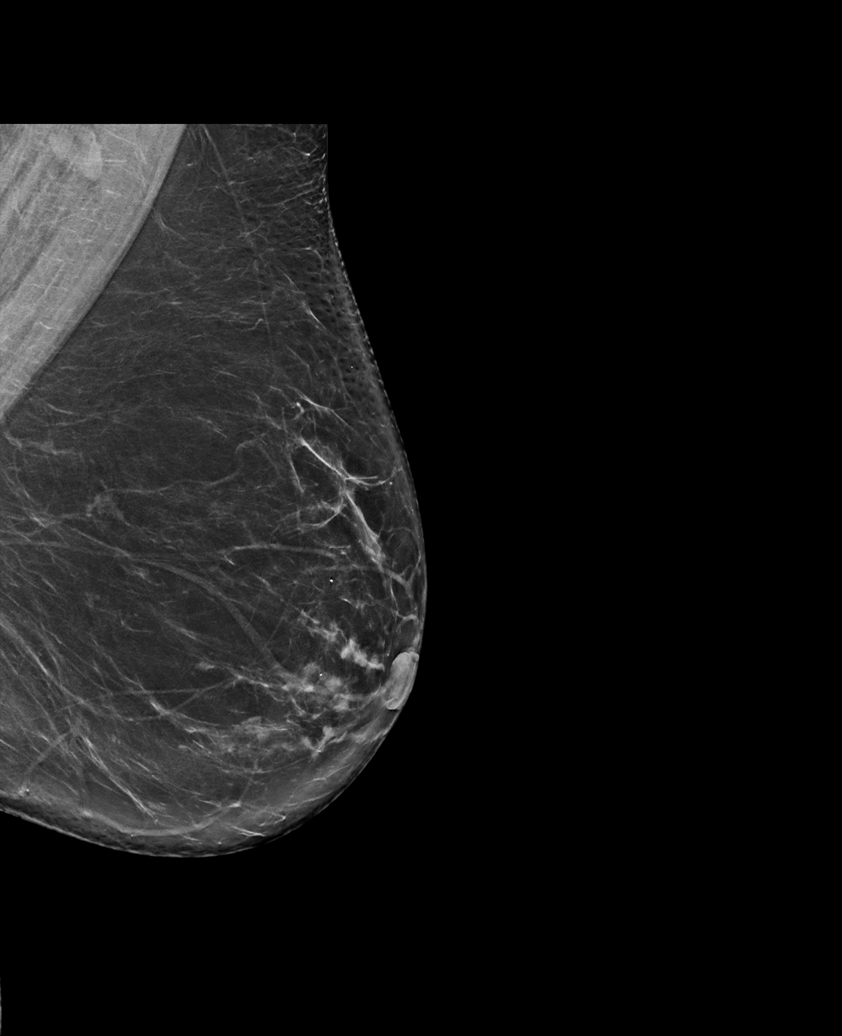

[L CC synth-2D]
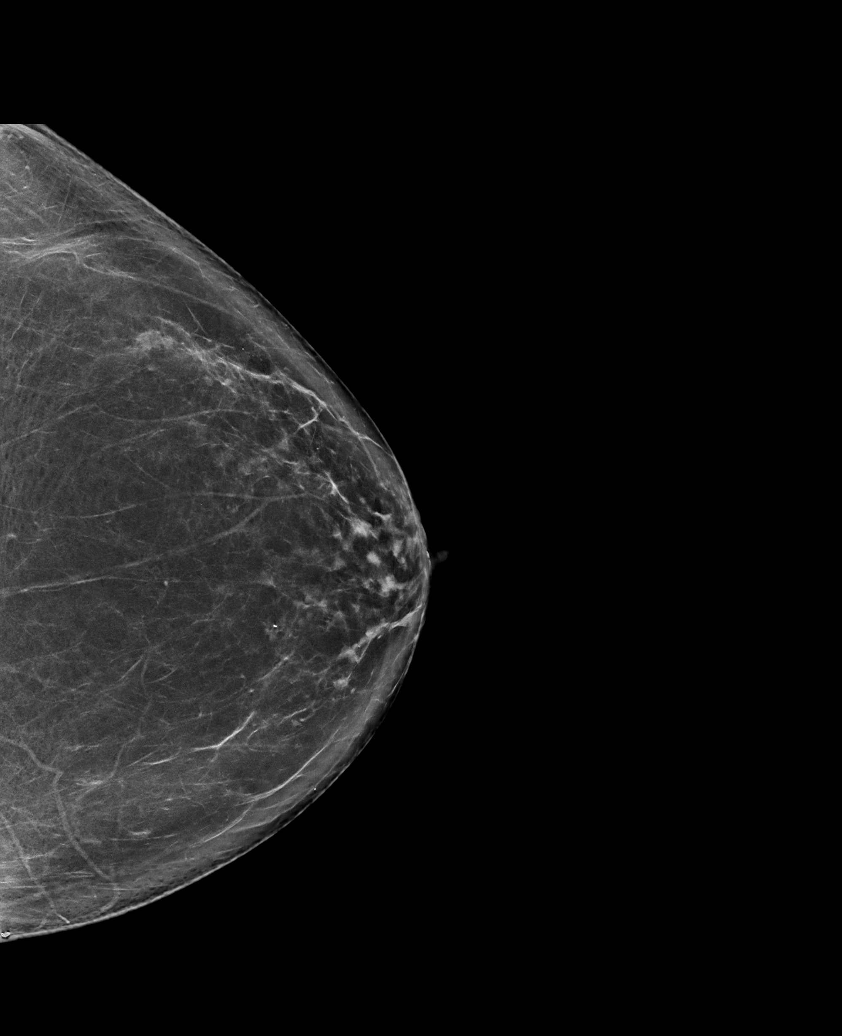

[L MLO synth-2D (2 of 2)]
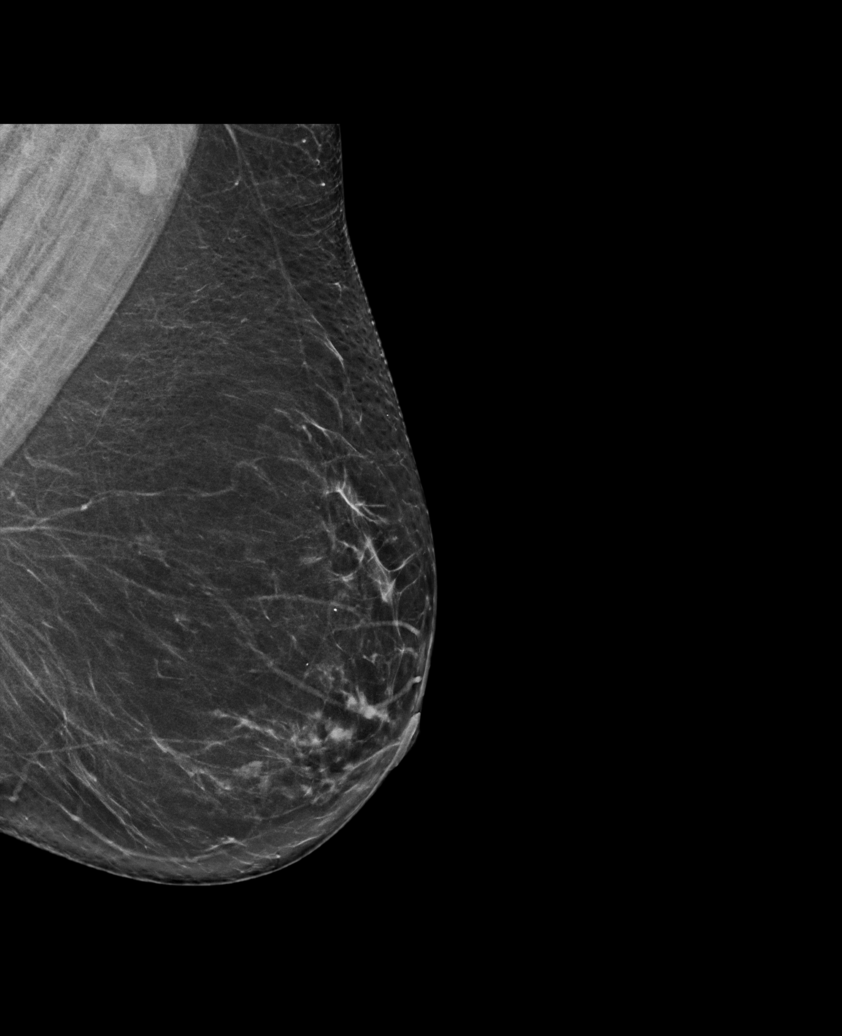

[R CC synth-2D]
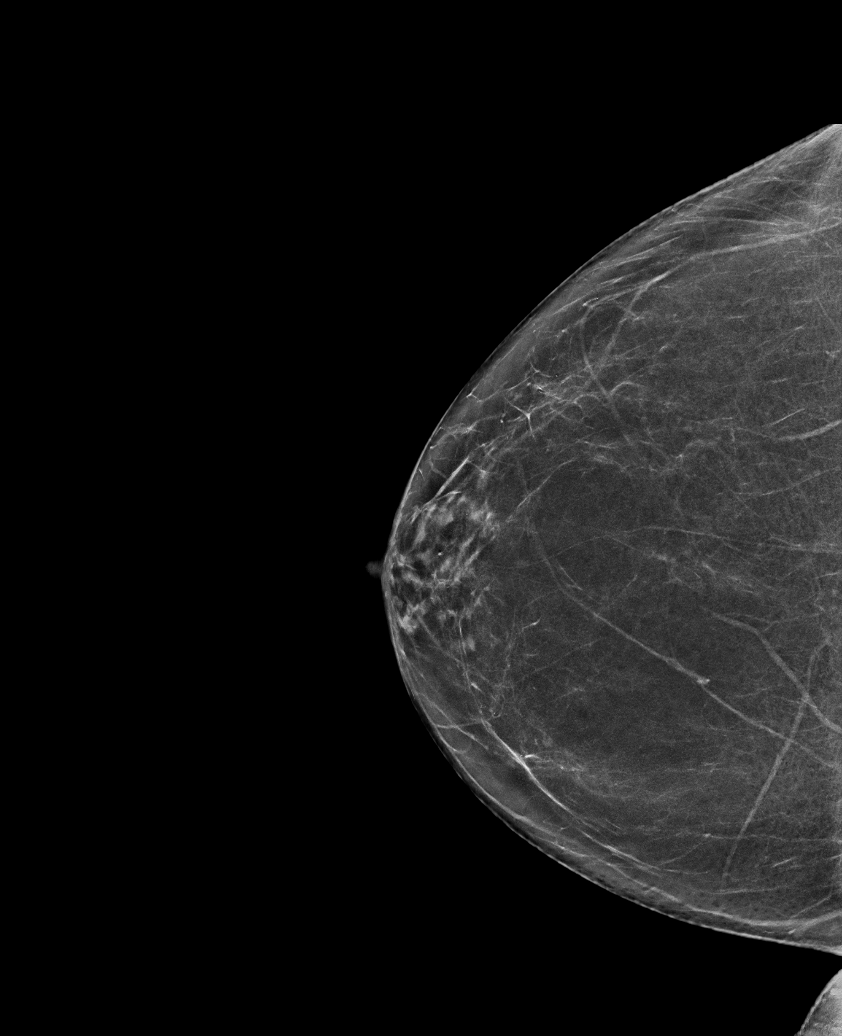

[R MLO synth-2D]
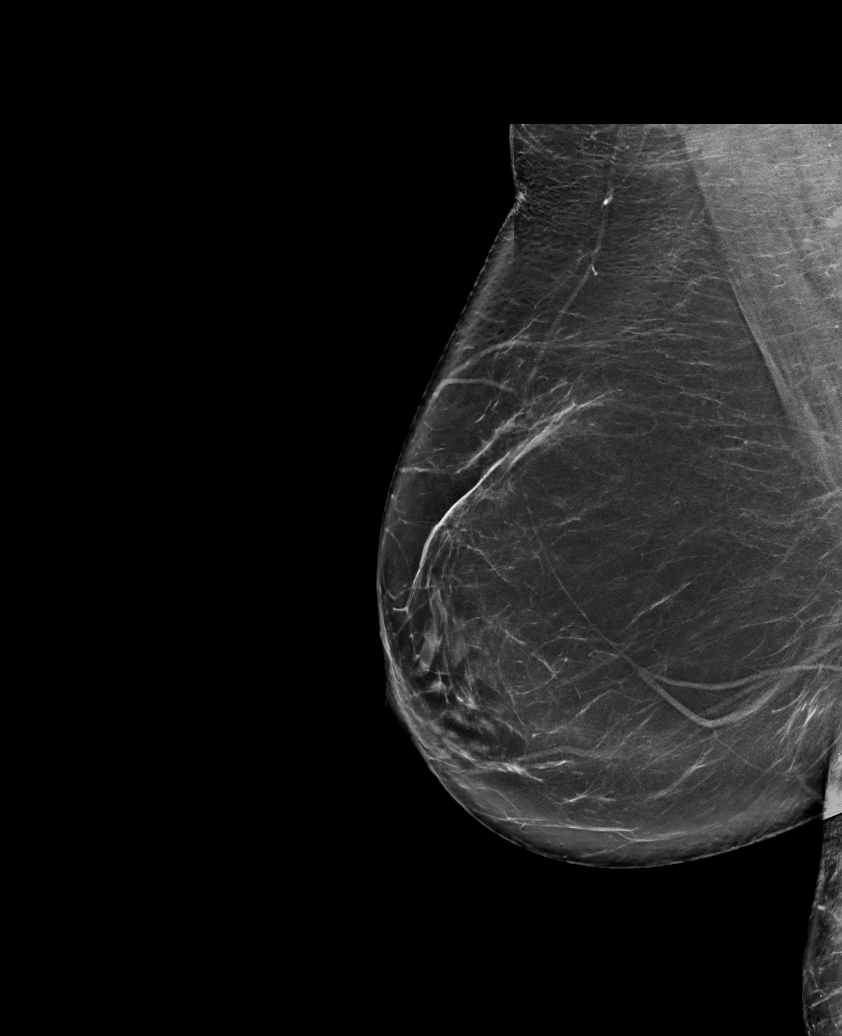

[L MLO tomo · tomo slice 37/73.0]
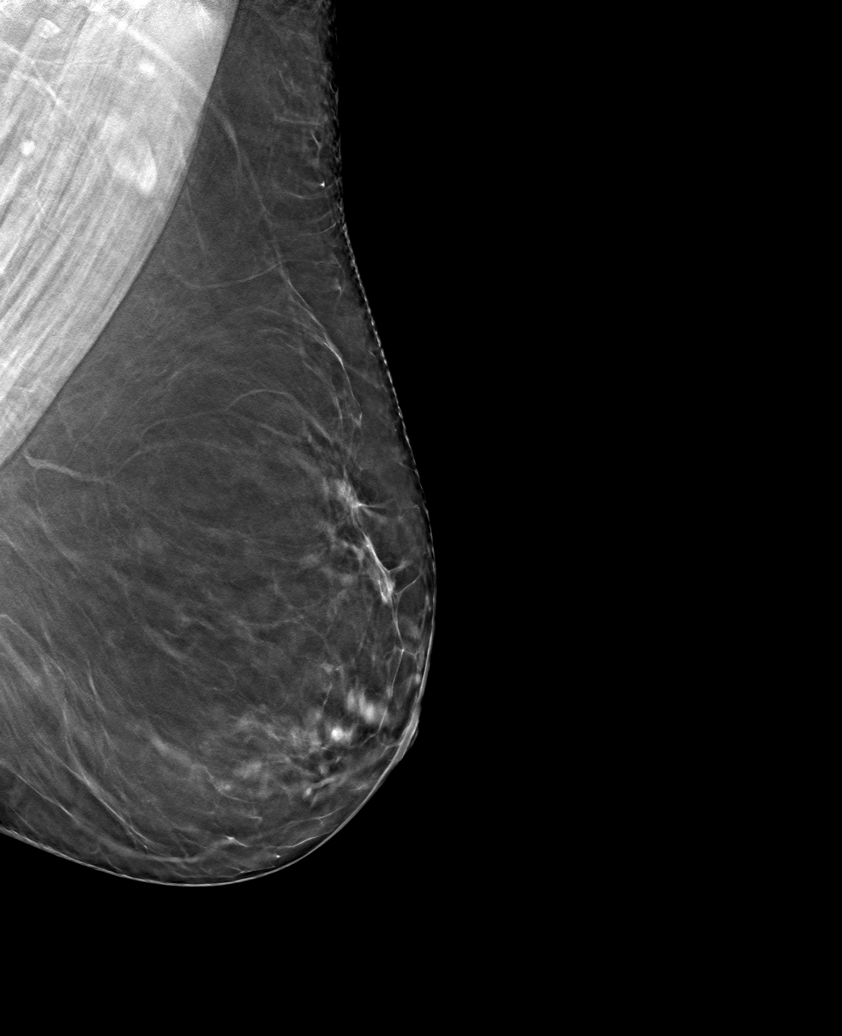

[6 of 30 positions shown; findings below may reference images not displayed]

ACR Breast Density Category b: There are scattered areas of
fibroglandular density.
FINDINGS: There are no findings suspicious for malignancy. Images were
processed with CAD.
IMPRESSION: No mammographic evidence of malignancy. A result letter of this
screening mammogram will be mailed directly to the patient.

RECOMMENDATION:
Screening mammogram in one year. (Code:CN-U-775)

BI-RADS CATEGORY  1: Negative.

## 2019-11-15 ENCOUNTER — Other Ambulatory Visit: Payer: Self-pay | Admitting: Internal Medicine

## 2019-11-15 DIAGNOSIS — Z1231 Encounter for screening mammogram for malignant neoplasm of breast: Secondary | ICD-10-CM

## 2020-01-05 ENCOUNTER — Other Ambulatory Visit: Payer: Self-pay | Admitting: Internal Medicine

## 2020-01-05 DIAGNOSIS — Z13820 Encounter for screening for osteoporosis: Secondary | ICD-10-CM

## 2020-02-12 ENCOUNTER — Other Ambulatory Visit: Payer: Medicare Other

## 2020-02-22 ENCOUNTER — Other Ambulatory Visit: Payer: Self-pay

## 2020-02-22 ENCOUNTER — Ambulatory Visit
Admission: RE | Admit: 2020-02-22 | Discharge: 2020-02-22 | Disposition: A | Discharge status: Home / Self Care | Payer: Medicare HMO | Source: Ambulatory Visit | Attending: Internal Medicine | Admitting: Internal Medicine

## 2020-02-22 DIAGNOSIS — Z1231 Encounter for screening mammogram for malignant neoplasm of breast: Secondary | ICD-10-CM | POA: Insufficient documentation

## 2020-02-22 DIAGNOSIS — Z13820 Encounter for screening for osteoporosis: Secondary | ICD-10-CM | POA: Insufficient documentation

## 2020-02-22 DIAGNOSIS — Z780 Asymptomatic menopausal state: Secondary | ICD-10-CM | POA: Insufficient documentation

## 2020-06-02 ENCOUNTER — Encounter: Payer: Self-pay | Admitting: Physician Assistant

## 2020-06-02 ENCOUNTER — Telehealth: Payer: Medicare Other | Admitting: Physician Assistant

## 2020-06-02 DIAGNOSIS — B9689 Other specified bacterial agents as the cause of diseases classified elsewhere: Secondary | ICD-10-CM

## 2020-06-02 DIAGNOSIS — J019 Acute sinusitis, unspecified: Secondary | ICD-10-CM

## 2020-06-02 MED ORDER — BENZONATATE 100 MG PO CAPS: 100.0000 mg | ORAL_CAPSULE | Freq: Three times a day (TID) | ORAL | 0 refills | Status: DC | PRN

## 2020-06-02 MED ORDER — AMOXICILLIN-POT CLAVULANATE 875-125 MG PO TABS: 1.0000 | ORAL_TABLET | Freq: Two times a day (BID) | ORAL | 0 refills | Status: DC

## 2020-06-02 NOTE — Progress Notes (Signed)
Suzanne [name], [place] are scheduled for a virtual visit with your provider today.    Just as we do with appointments in the office, we must obtain your consent to participate.  Your consent will be active for this visit and any virtual visit you may have with one of our providers in the next 365 days.    If you have a MyChart account, I can also send a copy of this consent to you electronically.  All virtual visits are billed to your insurance company just like a traditional visit in the office.  As this is a virtual visit, video technology does not allow for your provider to perform a traditional examination.  This may limit your provider's ability to fully assess your condition.  If your provider identifies any concerns that need to be evaluated in person or the need to arrange testing such as labs, EKG, etc, we will make arrangements to do so.    Although advances in technology are sophisticated, we cannot ensure that it will always work on either your end or our end.  If the connection with a video visit is poor, we may have to switch to a telephone visit.  With either a video or telephone visit, we are not always able to ensure that we have a secure connection.   I need to obtain your verbal consent now.   Are you willing to proceed with your visit today?   Suzanne [name] has provided verbal consent on 06/02/2020 for a virtual visit (video or telephone).   Mar Daring, PA-C 06/02/2020  8:25 AM    MyChart Video Visit    Virtual Visit via Video Note   This visit type was conducted due to national recommendations for restrictions regarding the COVID-19 Pandemic (e.g. social distancing) in an effort to limit this patient's exposure and mitigate transmission in our community. This patient is at least at moderate risk for complications without adequate follow up. This format is felt to be most appropriate for this patient at this time. Physical exam was limited by quality of the video and audio  technology used for the visit.   Patient location: Home Provider location: Home office in [place]  I discussed the limitations of evaluation and management by telemedicine and the availability of in person appointments. The patient expressed understanding and agreed to proceed.  Patient: Suzanne [name]   DOB: August 03, 1950   70 y.o. Female  MRN: [id number] Visit Date: 06/02/2020  Today's healthcare provider: Mar Daring, PA-C   No chief complaint on file.  Subjective    URI  This is a new problem. The current episode started in the past 7 days (Started on Tuesday). The problem has been gradually worsening. There has been no fever. Associated symptoms include congestion, coughing, ear pain (earlier on, better today), headaches (first 2 days, better now), rhinorrhea, sinus pain and a sore throat (scratchy\). Pertinent negatives include no chest pain, diarrhea, nausea, neck pain, vomiting or wheezing. She has tried decongestant and increased fluids for the symptoms. The treatment provided no relief.    Patient took an at home covid test yesterday and was negative.   Patient Active Problem List   Diagnosis Date Noted  . Gallstones 05/29/2012   Past Medical History:  Diagnosis Date  . Anxiety   . Cancer ([place]) 1989   Hx: of colon cancer  . Depression   . Fibromyalgia   . Headache(784.0)    Hx: of migraines  . Hypercholesteremia   .  Hypertension   . IBS (irritable bowel syndrome)   . Neuropathy   . Ovarian cyst    Hx: of   . Pneumonia few yrs ago  . Shortness of breath    Hx: of with panic attacks      Medications: Outpatient Medications Prior to Visit  Medication Sig  . acetaminophen (TYLENOL) 500 MG tablet Take 1,500 mg by mouth 2 (two) times daily as needed for mild pain.   . busPIRone (BUSPAR) 10 MG tablet Take 10 mg by mouth 2 (two) times daily.  . cetirizine (ZYRTEC) 10 MG tablet Take 10 mg by mouth at bedtime.  . fenofibrate 160 MG tablet Take 160 mg by  mouth at bedtime.   . fluticasone (FLONASE) 50 MCG/ACT nasal spray Place 2 sprays into the nose daily as needed for allergies.   Marland Kitchen gabapentin (NEURONTIN) 800 MG tablet Take 800 mg by mouth 3 (three) times daily.  . meclizine (ANTIVERT) 25 MG tablet Take 25 mg by mouth 3 (three) times daily as needed for dizziness.   . pravastatin (PRAVACHOL) 80 MG tablet Take 80 mg by mouth at bedtime.   . triamterene-hydrochlorothiazide (MAXZIDE-25) 37.5-25 MG per tablet Take 1 tablet by mouth daily.  Marland Kitchen venlafaxine XR (EFFEXOR-XR) 150 MG 24 hr capsule Take 150 mg by mouth 2 (two) times daily.   No facility-administered medications prior to visit.    Review of Systems  HENT: Positive for congestion, ear pain (earlier on, better today), rhinorrhea, sinus pain and sore throat (scratchy\).   Respiratory: Positive for cough. Negative for wheezing.   Cardiovascular: Negative for chest pain.  Gastrointestinal: Negative for diarrhea, nausea and vomiting.  Musculoskeletal: Negative for neck pain.  Neurological: Positive for headaches (first 2 days, better now).    Last CBC Lab Results  Component Value Date   WBC 8.3 07/31/2012   HGB 12.0 07/31/2012   HCT 36.5 07/31/2012   MCV 98.9 07/31/2012   MCH 32.5 07/31/2012   RDW 13.1 07/31/2012   PLT 238 62/95/2841   Last metabolic panel Lab Results  Component Value Date   GLUCOSE 134 (H) 07/31/2012   NA 143 07/31/2012   K 3.5 07/31/2012   CL 103 07/31/2012   CO2 30 07/31/2012   BUN 19 07/31/2012   CREATININE 0.59 07/31/2012   GFRNONAA >90 07/31/2012   GFRAA >90 07/31/2012   CALCIUM 8.5 07/31/2012   PROT 6.5 07/31/2012   ALBUMIN 3.5 07/31/2012   BILITOT 0.3 07/31/2012   ALKPHOS 25 (L) 07/31/2012   AST 64 (H) 07/31/2012   ALT 55 (H) 07/31/2012   ANIONGAP 6 (L) 09/18/2011      Objective    There were no vitals taken for this visit. BP Readings from Last 3 Encounters:  06/18/16 125/79  08/13/12 120/80  07/31/12 124/73   Wt Readings from Last 3  Encounters:  06/18/16 190 lb (86.2 kg)  08/13/12 193 lb (87.5 kg)  05/29/12 195 lb (88.5 kg)      Physical Exam     Assessment & Plan     1. Acute bacterial sinusitis - Worsening symptoms that have not responded to OTC medications.  - Will give augmentin and tessalon perles as below.  - Continue allergy medications.  - Stay well hydrated and get plenty of rest.  - Seek in-person evaluation if no symptom improvement or if symptoms worsen. - amoxicillin-clavulanate (AUGMENTIN) 875-125 MG tablet; Take 1 tablet by mouth 2 (two) times daily.  Dispense: 20 tablet; Refill: 0 - benzonatate (TESSALON)  100 MG capsule; Take 1 capsule (100 mg total) by mouth 3 (three) times daily as needed for cough.  Dispense: 30 capsule; Refill: 0   No follow-ups on file.     I discussed the assessment and treatment plan with the patient. The patient was provided an opportunity to ask questions and all were answered. The patient agreed with the plan and demonstrated an understanding of the instructions.   The patient was advised to call back or seek an in-person evaluation if the symptoms worsen or if the condition fails to improve as anticipated.  I provided 16 minutes of face-to-face time during this encounter via MyChart Video enabled encounter.   [place] [phone] (phone) [phone] (fax)  [place]

## 2020-06-02 NOTE — Patient Instructions (Signed)

## 2021-01-03 ENCOUNTER — Telehealth: Payer: Medicare Other

## 2021-01-04 ENCOUNTER — Telehealth: Payer: Medicare Other

## 2021-07-20 ENCOUNTER — Other Ambulatory Visit: Payer: Self-pay | Admitting: Family Medicine

## 2021-07-20 DIAGNOSIS — Z1231 Encounter for screening mammogram for malignant neoplasm of breast: Secondary | ICD-10-CM

## 2021-08-01 ENCOUNTER — Ambulatory Visit
Admission: RE | Admit: 2021-08-01 | Discharge: 2021-08-01 | Disposition: A | Discharge status: Home / Self Care | Payer: Medicare Other | Source: Ambulatory Visit | Attending: Family Medicine | Admitting: Family Medicine

## 2021-08-01 DIAGNOSIS — Z1231 Encounter for screening mammogram for malignant neoplasm of breast: Secondary | ICD-10-CM | POA: Insufficient documentation

## 2021-08-09 ENCOUNTER — Other Ambulatory Visit: Payer: Self-pay | Admitting: Family Medicine

## 2021-08-09 DIAGNOSIS — R928 Other abnormal and inconclusive findings on diagnostic imaging of breast: Secondary | ICD-10-CM

## 2021-08-09 DIAGNOSIS — N63 Unspecified lump in unspecified breast: Secondary | ICD-10-CM

## 2021-08-24 ENCOUNTER — Ambulatory Visit
Admission: RE | Admit: 2021-08-24 | Discharge: 2021-08-24 | Disposition: A | Discharge status: Home / Self Care | Payer: Medicare Other | Source: Ambulatory Visit | Attending: Family Medicine | Admitting: Family Medicine

## 2021-08-24 DIAGNOSIS — N63 Unspecified lump in unspecified breast: Secondary | ICD-10-CM | POA: Diagnosis present

## 2021-08-24 DIAGNOSIS — R928 Other abnormal and inconclusive findings on diagnostic imaging of breast: Secondary | ICD-10-CM | POA: Diagnosis present

## 2022-02-20 ENCOUNTER — Ambulatory Visit
Admission: RE | Admit: 2022-02-20 | Discharge: 2022-02-20 | Disposition: A | Discharge status: Home / Self Care | Payer: Medicare Other | Source: Ambulatory Visit | Attending: Family Medicine | Admitting: Family Medicine

## 2022-02-20 ENCOUNTER — Ambulatory Visit
Admission: RE | Admit: 2022-02-20 | Discharge: 2022-02-20 | Disposition: A | Discharge status: Home / Self Care | Payer: Medicare Other | Attending: Family Medicine | Admitting: Family Medicine

## 2022-02-20 ENCOUNTER — Other Ambulatory Visit: Payer: Self-pay | Admitting: Family Medicine

## 2022-02-20 DIAGNOSIS — J209 Acute bronchitis, unspecified: Secondary | ICD-10-CM

## 2022-04-09 ENCOUNTER — Other Ambulatory Visit: Payer: Self-pay

## 2022-04-10 ENCOUNTER — Ambulatory Visit: Payer: Medicare Other | Admitting: Gastroenterology

## 2022-04-10 ENCOUNTER — Encounter: Payer: Self-pay | Admitting: Gastroenterology

## 2022-04-10 VITALS — BP 127/84 | HR 111 | Temp 98.8°F | Ht 66.0 in | Wt 186.0 lb

## 2022-04-10 DIAGNOSIS — K5904 Chronic idiopathic constipation: Secondary | ICD-10-CM

## 2022-04-10 NOTE — Progress Notes (Signed)
Cephas Darby, MD [address]  [place]  [place], [place] [address]  Main: [phone]  Fax: [phone]    Gastroenterology Consultation  Referring Provider:     Denton Lank, MD Primary Care Physician:  Denton Lank, MD Primary Gastroenterologist:  Dr. Cephas Darby Reason for Consultation: Chronic constipation        HPI:   Suzanne [name] is a 72 y.o. female referred by Dr. Denton Lank, MD  for consultation & management of chronic constipation.  She states that she has been suffering from constipation for a long time.  She states that MiraLAX does not work.  If she takes GoLytely 1 dose in 10 ounces of water daily, it helps to move her bowels.  She denies any other symptoms.  She tries to eat healthy.  She denies any other GI symptoms.  She denies any abdominal bloating, nausea or vomiting.  She is due for colonoscopy next year.  She wanted to establish care with GI.  Patient reports history of carcinoid tumor s/p resection in 1989  NSAIDs: None  Antiplts/Anticoagulants/Anti thrombotics: None  GI Procedures:  Colonoscopy 06/18/2016: Good prep, normal  Past Medical History:  Diagnosis Date   Anxiety    Cancer ([place]) 1989   Hx: of colon cancer   Depression    Fibromyalgia    Headache(784.0)    Hx: of migraines   Hypercholesteremia    Hypertension    IBS (irritable bowel syndrome)    Neuropathy    Ovarian cyst    Hx: of    Pneumonia few yrs ago   Shortness of breath    Hx: of with panic attacks    Past Surgical History:  Procedure Laterality Date   BREAST BIOPSY Right 2009   benign   CHOLECYSTECTOMY N/A 07/29/2012   Procedure: LAPAROSCOPIC CHOLECYSTECTOMY WITH INTRAOPERATIVE CHOLANGIOGRAM;  Surgeon: Haywood Lasso, MD;  Location: [facility];  Service: General;  Laterality: N/A;   COLON RESECTION  1989   colon cancer   COLONOSCOPY WITH PROPOFOL N/A 06/18/2016   Procedure: COLONOSCOPY WITH PROPOFOL;  Surgeon: Garlan Fair, MD;  Location: [facility]  ENDOSCOPY;  Service: Endoscopy;  Laterality: N/A;   colonscopy  every 5 yrs   DILATION AND CURETTAGE OF UTERUS      Hx: of over 30 years ago   [place]     Current Outpatient Medications:    acetaminophen (TYLENOL) 500 MG tablet, Take 1,500 mg by mouth 2 (two) times daily as needed for mild pain. , Disp: , Rfl:    albuterol (VENTOLIN HFA) 108 (90 Base) MCG/ACT inhaler, Inhale 2 puffs into the lungs every 4 (four) hours as needed., Disp: , Rfl:    busPIRone (BUSPAR) 10 MG tablet, Take 10 mg by mouth 2 (two) times daily., Disp: , Rfl:    cetirizine (ZYRTEC) 10 MG tablet, Take 10 mg by mouth at bedtime., Disp: , Rfl:    fenofibrate (TRICOR) 145 MG tablet, Take 145 mg by mouth daily., Disp: , Rfl:    fluticasone (FLONASE) 50 MCG/ACT nasal spray, Place 2 sprays into the nose daily as needed for allergies. , Disp: , Rfl:    gabapentin (NEURONTIN) 800 MG tablet, Take 800 mg by mouth 3 (three) times daily., Disp: , Rfl:    meclizine (ANTIVERT) 25 MG tablet, Take 25 mg by mouth 3 (three) times daily as needed for dizziness. , Disp: , Rfl:  metFORMIN (GLUCOPHAGE) 500 MG tablet, Take 500 mg by mouth daily., Disp: , Rfl:    pravastatin (PRAVACHOL) 80 MG tablet, Take 80 mg by mouth at bedtime. , Disp: , Rfl:    spironolactone (ALDACTONE) 50 MG tablet, Take 50 mg by mouth 2 (two) times daily., Disp: , Rfl:    triamterene-hydrochlorothiazide (MAXZIDE-25) 37.5-25 MG per tablet, Take 1 tablet by mouth daily., Disp: , Rfl:    venlafaxine XR (EFFEXOR-XR) 150 MG 24 hr capsule, Take 150 mg by mouth 2 (two) times daily., Disp: , Rfl:    Family History  Problem Relation Age of Onset   Stomach cancer Father    Cancer Father    Cancer Sister    Breast cancer Paternal Aunt      Social History   Tobacco Use   Smoking status: Never   Smokeless tobacco: Never  Vaping Use   Vaping Use: Never used  Substance Use Topics   Alcohol use: Not  Currently    Comment: rarely   Drug use: No    Allergies as of 04/10/2022 - Review Complete 04/10/2022  Allergen Reaction Noted   Contrast media [iodinated contrast media] Swelling 05/29/2012   Latex Rash 07/29/2012    Review of Systems:    All systems reviewed and negative except where noted in HPI.   Physical Exam:  BP 127/84 (BP Location: Left Arm, Patient Position: Sitting, Cuff Size: Normal)   Pulse (!) 111   Temp 98.8 F (37.1 C) (Oral)   Ht '5\' 6"'$  (1.676 m)   Wt 186 lb (84.4 kg)   BMI 30.02 kg/m  No LMP recorded. Patient has had a hysterectomy.  General:   Alert,  Well-developed, well-nourished, pleasant and cooperative in NAD Head:  Normocephalic and atraumatic. Eyes:  Sclera clear, no icterus.   Conjunctiva pink. Ears:  Normal auditory acuity. Nose:  No deformity, discharge, or lesions. Mouth:  No deformity or lesions,oropharynx pink & moist. Neck:  Supple; no masses or thyromegaly. Lungs:  Respirations even and unlabored.  Clear throughout to auscultation.   No wheezes, crackles, or rhonchi. No acute distress. Heart:  Regular rate and rhythm; no murmurs, clicks, rubs, or gallops. Abdomen:  Normal bowel sounds. Soft, non-tender and non-distended without masses, hepatosplenomegaly or hernias noted.  No guarding or rebound tenderness.   Rectal: Not performed Msk:  Symmetrical without gross deformities. Good, equal movement & strength bilaterally. Pulses:  Normal pulses noted. Extremities:  No clubbing or edema.  No cyanosis. Neurologic:  Alert and oriented x3;  grossly normal neurologically. Skin:  Intact without significant lesions or rashes. No jaundice. Psych:  Alert and cooperative. Normal mood and affect.  Imaging Studies: No abdominal imaging  Assessment and Plan:   Suzanne [name] is a 72 y.o. female with history of rectal carcinoid tumor s/p resection in 1989, s/p cholecystectomy is seen in consultation for chronic constipation  Chronic  constipation Currently managed with GoLytely 1 dose in 10 ounce of water daily Discussed about high-fiber diet, information provided  Schedule screening colonoscopy next year  Follow up as needed   Cephas Darby, MD

## 2022-07-26 ENCOUNTER — Encounter: Payer: Self-pay | Admitting: Family Medicine

## 2022-07-26 DIAGNOSIS — Z1231 Encounter for screening mammogram for malignant neoplasm of breast: Secondary | ICD-10-CM

## 2022-08-22 ENCOUNTER — Other Ambulatory Visit: Payer: Self-pay | Admitting: Family Medicine

## 2022-08-22 DIAGNOSIS — Z1231 Encounter for screening mammogram for malignant neoplasm of breast: Secondary | ICD-10-CM

## 2022-08-23 ENCOUNTER — Other Ambulatory Visit: Payer: Self-pay | Admitting: Family Medicine

## 2022-08-23 DIAGNOSIS — Z780 Asymptomatic menopausal state: Secondary | ICD-10-CM

## 2022-11-28 ENCOUNTER — Ambulatory Visit
Admission: RE | Admit: 2022-11-28 | Discharge: 2022-11-28 | Disposition: A | Discharge status: Home / Self Care | Payer: Medicare PPO | Source: Ambulatory Visit | Attending: Family Medicine | Admitting: Family Medicine

## 2022-11-28 DIAGNOSIS — Z1231 Encounter for screening mammogram for malignant neoplasm of breast: Secondary | ICD-10-CM

## 2022-11-28 DIAGNOSIS — Z780 Asymptomatic menopausal state: Secondary | ICD-10-CM | POA: Insufficient documentation

## 2023-11-11 ENCOUNTER — Other Ambulatory Visit: Payer: Self-pay | Admitting: Family Medicine

## 2023-11-11 DIAGNOSIS — Z1231 Encounter for screening mammogram for malignant neoplasm of breast: Secondary | ICD-10-CM

## 2024-01-08 ENCOUNTER — Ambulatory Visit
Admission: RE | Admit: 2024-01-08 | Discharge: 2024-01-08 | Disposition: A | Discharge status: Home / Self Care | Source: Ambulatory Visit | Attending: Family Medicine | Admitting: Family Medicine

## 2024-01-08 DIAGNOSIS — Z1231 Encounter for screening mammogram for malignant neoplasm of breast: Secondary | ICD-10-CM | POA: Insufficient documentation

## 2024-01-17 ENCOUNTER — Emergency Department

## 2024-01-17 ENCOUNTER — Other Ambulatory Visit: Payer: Self-pay

## 2024-01-17 ENCOUNTER — Inpatient Hospital Stay
Admission: EM | Admit: 2024-01-17 | Discharge: 2024-01-21 | DRG: 536 | Disposition: A | Discharge status: Skilled Nursing Facility | Attending: Internal Medicine | Admitting: Internal Medicine

## 2024-01-17 DIAGNOSIS — Z7984 Long term (current) use of oral hypoglycemic drugs: Secondary | ICD-10-CM | POA: Diagnosis not present

## 2024-01-17 DIAGNOSIS — F32A Depression, unspecified: Secondary | ICD-10-CM | POA: Diagnosis present

## 2024-01-17 DIAGNOSIS — W010XXA Fall on same level from slipping, tripping and stumbling without subsequent striking against object, initial encounter: Secondary | ICD-10-CM | POA: Diagnosis present

## 2024-01-17 DIAGNOSIS — S3282XA Multiple fractures of pelvis without disruption of pelvic ring, initial encounter for closed fracture: Secondary | ICD-10-CM

## 2024-01-17 DIAGNOSIS — S329XXA Fracture of unspecified parts of lumbosacral spine and pelvis, initial encounter for closed fracture: Secondary | ICD-10-CM | POA: Diagnosis present

## 2024-01-17 DIAGNOSIS — S32511A Fracture of superior rim of right pubis, initial encounter for closed fracture: Secondary | ICD-10-CM

## 2024-01-17 DIAGNOSIS — Z803 Family history of malignant neoplasm of breast: Secondary | ICD-10-CM

## 2024-01-17 DIAGNOSIS — S3210XA Unspecified fracture of sacrum, initial encounter for closed fracture: Secondary | ICD-10-CM | POA: Diagnosis not present

## 2024-01-17 DIAGNOSIS — E559 Vitamin D deficiency, unspecified: Secondary | ICD-10-CM | POA: Diagnosis present

## 2024-01-17 DIAGNOSIS — I10 Essential (primary) hypertension: Secondary | ICD-10-CM | POA: Diagnosis present

## 2024-01-17 DIAGNOSIS — G629 Polyneuropathy, unspecified: Secondary | ICD-10-CM | POA: Diagnosis present

## 2024-01-17 DIAGNOSIS — F419 Anxiety disorder, unspecified: Secondary | ICD-10-CM | POA: Diagnosis present

## 2024-01-17 DIAGNOSIS — Z800 Family history of malignant neoplasm of digestive organs: Secondary | ICD-10-CM

## 2024-01-17 DIAGNOSIS — Z91040 Latex allergy status: Secondary | ICD-10-CM | POA: Diagnosis not present

## 2024-01-17 DIAGNOSIS — K589 Irritable bowel syndrome without diarrhea: Secondary | ICD-10-CM | POA: Diagnosis present

## 2024-01-17 DIAGNOSIS — S3219XA Other fracture of sacrum, initial encounter for closed fracture: Secondary | ICD-10-CM | POA: Diagnosis present

## 2024-01-17 DIAGNOSIS — M797 Fibromyalgia: Secondary | ICD-10-CM | POA: Diagnosis present

## 2024-01-17 DIAGNOSIS — Y92009 Unspecified place in unspecified non-institutional (private) residence as the place of occurrence of the external cause: Secondary | ICD-10-CM | POA: Diagnosis not present

## 2024-01-17 DIAGNOSIS — S32591A Other specified fracture of right pubis, initial encounter for closed fracture: Principal | ICD-10-CM | POA: Diagnosis present

## 2024-01-17 DIAGNOSIS — Z85038 Personal history of other malignant neoplasm of large intestine: Secondary | ICD-10-CM | POA: Diagnosis not present

## 2024-01-17 DIAGNOSIS — Z79899 Other long term (current) drug therapy: Secondary | ICD-10-CM | POA: Diagnosis not present

## 2024-01-17 DIAGNOSIS — E7800 Pure hypercholesterolemia, unspecified: Secondary | ICD-10-CM | POA: Diagnosis present

## 2024-01-17 DIAGNOSIS — M25551 Pain in right hip: Secondary | ICD-10-CM | POA: Diagnosis present

## 2024-01-17 DIAGNOSIS — S0990XA Unspecified injury of head, initial encounter: Secondary | ICD-10-CM

## 2024-01-17 DIAGNOSIS — Z91041 Radiographic dye allergy status: Secondary | ICD-10-CM | POA: Diagnosis not present

## 2024-01-17 DIAGNOSIS — W19XXXA Unspecified fall, initial encounter: Secondary | ICD-10-CM | POA: Diagnosis not present

## 2024-01-17 LAB — COMPREHENSIVE METABOLIC PANEL WITH GFR
ALT: 19 U/L (ref 0–44)
AST: 26 U/L (ref 15–41)
Albumin: 4.3 g/dL (ref 3.5–5.0)
Alkaline Phosphatase: 35 U/L — ABNORMAL LOW (ref 38–126)
Anion gap: 9 (ref 5–15)
BUN: 23 mg/dL (ref 8–23)
CO2: 26 mmol/L (ref 22–32)
Calcium: 9.4 mg/dL (ref 8.9–10.3)
Chloride: 103 mmol/L (ref 98–111)
Creatinine, Ser: 0.85 mg/dL (ref 0.44–1.00)
GFR, Estimated: >60 mL/min
Glucose, Bld: 105 mg/dL — ABNORMAL HIGH (ref 70–99)
Potassium: 4.4 mmol/L (ref 3.5–5.1)
Sodium: 138 mmol/L (ref 135–145)
Total Bilirubin: 0.2 mg/dL (ref 0.0–1.2)
Total Protein: 6.4 g/dL — ABNORMAL LOW (ref 6.5–8.1)

## 2024-01-17 LAB — CBC WITH DIFFERENTIAL/PLATELET
Abs Immature Granulocytes: 0.27 K/uL — ABNORMAL HIGH (ref 0.00–0.07)
Basophils Absolute: 0.1 K/uL (ref 0.0–0.1)
Basophils Relative: 1 %
Eosinophils Absolute: 0.2 K/uL (ref 0.0–0.5)
Eosinophils Relative: 3 %
HCT: 38.2 % (ref 36.0–46.0)
Hemoglobin: 12.6 g/dL (ref 12.0–15.0)
Immature Granulocytes: 3 %
Lymphocytes Relative: 42 %
Lymphs Abs: 3.3 K/uL (ref 0.7–4.0)
MCH: 33.4 pg (ref 26.0–34.0)
MCHC: 33 g/dL (ref 30.0–36.0)
MCV: 101.3 fL — ABNORMAL HIGH (ref 80.0–100.0)
Monocytes Absolute: 0.8 K/uL (ref 0.1–1.0)
Monocytes Relative: 10 %
Neutro Abs: 3.3 K/uL (ref 1.7–7.7)
Neutrophils Relative %: 41 %
Platelets: 299 K/uL (ref 150–400)
RBC: 3.77 MIL/uL — ABNORMAL LOW (ref 3.87–5.11)
RDW: 12.3 % (ref 11.5–15.5)
WBC: 7.9 K/uL (ref 4.0–10.5)
nRBC: 0 % (ref 0.0–0.2)

## 2024-01-17 LAB — CK: Total CK: 123 U/L (ref 38–234)

## 2024-01-17 MED ORDER — HYDROMORPHONE HCL 1 MG/ML IJ SOLN
0.5000 mg | INTRAMUSCULAR | Status: DC | PRN
  Administered 2024-01-17 – 2024-01-18 (×4): 0.5 mg via INTRAVENOUS
  Filled 2024-01-17 (×4): qty 0.5

## 2024-01-17 MED ORDER — SENNOSIDES-DOCUSATE SODIUM 8.6-50 MG PO TABS
1.0000 | ORAL_TABLET | Freq: Every day | ORAL | Status: DC
  Administered 2024-01-17 – 2024-01-18 (×2): 1 via ORAL
  Filled 2024-01-17 (×2): qty 1

## 2024-01-17 MED ORDER — MORPHINE SULFATE (PF) 2 MG/ML IV SOLN
2.0000 mg | INTRAVENOUS | Status: DC | PRN
  Administered 2024-01-17 (×2): 2 mg via INTRAVENOUS
  Filled 2024-01-17 (×2): qty 1

## 2024-01-17 MED ORDER — OXYCODONE HCL 5 MG PO TABS: 5.0000 mg | ORAL_TABLET | Freq: Four times a day (QID) | ORAL | Status: DC | PRN

## 2024-01-17 MED ORDER — ROSUVASTATIN CALCIUM 20 MG PO TABS
20.0000 mg | ORAL_TABLET | Freq: Every day | ORAL | Status: DC
  Administered 2024-01-17 – 2024-01-20 (×4): 20 mg via ORAL
  Filled 2024-01-17 (×5): qty 1

## 2024-01-17 MED ORDER — ONDANSETRON HCL 4 MG PO TABS
4.0000 mg | ORAL_TABLET | Freq: Four times a day (QID) | ORAL | Status: DC | PRN
  Administered 2024-01-18: 4 mg via ORAL
  Filled 2024-01-17: qty 1

## 2024-01-17 MED ORDER — ONDANSETRON HCL 4 MG/2ML IJ SOLN
4.0000 mg | Freq: Four times a day (QID) | INTRAMUSCULAR | Status: DC | PRN
  Filled 2024-01-17: qty 2

## 2024-01-17 MED ORDER — METFORMIN HCL 500 MG PO TABS
1000.0000 mg | ORAL_TABLET | Freq: Every day | ORAL | Status: DC
  Administered 2024-01-17 – 2024-01-20 (×4): 1000 mg via ORAL
  Filled 2024-01-17 (×4): qty 2

## 2024-01-17 MED ORDER — DOXYCYCLINE HYCLATE 50 MG PO CAPS
50.0000 mg | ORAL_CAPSULE | Freq: Every day | ORAL | Status: DC
  Administered 2024-01-17 – 2024-01-21 (×5): 50 mg via ORAL
  Filled 2024-01-17 (×5): qty 1

## 2024-01-17 MED ORDER — VENLAFAXINE HCL ER 150 MG PO CP24
150.0000 mg | ORAL_CAPSULE | Freq: Two times a day (BID) | ORAL | Status: DC
  Administered 2024-01-17 – 2024-01-21 (×9): 150 mg via ORAL
  Filled 2024-01-17 (×11): qty 1

## 2024-01-17 MED ORDER — POLYETHYLENE GLYCOL 3350 17 G PO PACK
17.0000 g | PACK | Freq: Two times a day (BID) | ORAL | Status: DC | PRN
  Administered 2024-01-19: 17 g via ORAL
  Filled 2024-01-17: qty 1

## 2024-01-17 MED ORDER — BUSPIRONE HCL 10 MG PO TABS
10.0000 mg | ORAL_TABLET | Freq: Two times a day (BID) | ORAL | Status: DC
  Administered 2024-01-17 – 2024-01-21 (×9): 10 mg via ORAL
  Filled 2024-01-17: qty 2
  Filled 2024-01-17 (×8): qty 1

## 2024-01-17 MED ORDER — ACETAMINOPHEN 325 MG PO TABS
975.0000 mg | ORAL_TABLET | Freq: Four times a day (QID) | ORAL | Status: DC
  Administered 2024-01-17 – 2024-01-21 (×13): 975 mg via ORAL
  Filled 2024-01-17 (×13): qty 3

## 2024-01-17 MED ORDER — ENOXAPARIN SODIUM 40 MG/0.4ML IJ SOSY
40.0000 mg | PREFILLED_SYRINGE | INTRAMUSCULAR | Status: DC
  Administered 2024-01-17 – 2024-01-20 (×4): 40 mg via SUBCUTANEOUS
  Filled 2024-01-17 (×4): qty 0.4

## 2024-01-17 NOTE — Progress Notes (Signed)
 OT Cancellation Note  Patient Details Name: MERSADIES PETREE MRN: [id number] DOB: 12/02/1950   Cancelled Treatment:    Reason Eval/Treat Not Completed: Other (comment). Per chart review, pt with pelvic fx and awaiting ortho consult with Dr. Cleotilde. Will await further instruction/parameter's before formal evaluation.   Izetta Claude, MS, OTR/L , CBIS ascom [phone]  01/17/2024, 9:46 AM

## 2024-01-17 NOTE — Evaluation (Signed)
 Occupational Therapy Evaluation Patient Details Name: Suzanne [name] MRN: [id number] DOB: 07/29/1950 Today's Date: 01/17/2024   History of Present Illness   Suzanne [name] is a 73 y.o. female with PMH of colon cancer s/p resection in 1989, anxiety, depression, fibromyalgia, IBS, osteoarthritis, HTN and neuropathy presented to ED after she sustained a fall at home.     Clinical Impressions Patient presenting with decreased Ind in self care,balance, functional mobility,transfers endurance, and safety awareness. Patient reports being Ind at baseline. Pt lives with two sisters and only uses can when she is having an off day.  Patient currently functioning at min A for bed mobility. Pt stands and takes only a few side steps with increased difficulty offloading weight to take side steps. Pt returning to supine at end of session.  Patient will benefit from acute OT to increase overall independence in the areas of ADLs, functional mobility, and safety awareness in order to safely discharge.     If plan is discharge home, recommend the following:   A lot of help with walking and/or transfers;A lot of help with bathing/dressing/bathroom;Assistance with cooking/housework;Direct supervision/assist for medications management;Assist for transportation     Functional Status Assessment   Patient has had a recent decline in their functional status and demonstrates the ability to make significant improvements in function in a reasonable and predictable amount of time.     Equipment Recommendations   Other (comment) (defer to next venue of care)      Precautions/Restrictions   Precautions Precautions: Fall Restrictions Weight Bearing Restrictions Per Provider Order: Yes RLE Weight Bearing Per Provider Order: Weight bearing as tolerated     Mobility Bed Mobility Overal bed mobility: Needs Assistance Bed Mobility: Supine to Sit, Sit to Supine     Supine to sit: Min assist Sit to  supine: Mod assist        Transfers Overall transfer level: Needs assistance Equipment used: Rolling walker (2 wheels) Transfers: Sit to/from Stand Sit to Stand: Min assist           General transfer comment: few side steps at bedside with assistance to move RW and cues for offloading to take steps      Balance Overall balance assessment: Needs assistance Sitting-balance support: No upper extremity supported Sitting balance-Leahy Scale: Good     Standing balance support: During functional activity, Reliant on assistive device for balance Standing balance-Leahy Scale: Good                             ADL either performed or assessed with clinical judgement   ADL Overall ADL's : Needs assistance/impaired                     Lower Body Dressing: Maximal assistance;Bed level Lower Body Dressing Details (indicate cue type and reason): to don B socks                     Vision Patient Visual Report: No change from baseline              Pertinent Vitals/Pain Pain Assessment Pain Assessment: Faces Faces Pain Scale: Hurts whole lot Pain Location: L LE Pain Descriptors / Indicators: Aching, Discomfort Pain Intervention(s): Limited activity within patient's tolerance, Monitored during session, Repositioned     Extremity/Trunk Assessment Upper Extremity Assessment Upper Extremity Assessment: Generalized weakness   Lower Extremity Assessment Lower Extremity Assessment: Generalized weakness   Cervical /  Trunk Assessment Cervical / Trunk Assessment: Normal   Communication Communication Communication: No apparent difficulties   Cognition Arousal: Alert Behavior During Therapy: WFL for tasks assessed/performed Cognition: No apparent impairments                               Following commands: Intact       Cueing  General Comments   Cueing Techniques: Verbal cues      Exercises     Shoulder Instructions       Home Living Family/patient expects to be discharged to:: Private residence Living Arrangements: Other relatives (2 sisters) Available Help at Discharge: Family;Available 24 hours/day Type of Home: Apartment Home Access: Level entry     Home Layout: One level     Bathroom Shower/Tub: Chief Strategy Officer: Standard     Home Equipment: Rollator (4 wheels);Cane - quad          Prior Functioning/Environment Prior Level of Function : Independent/Modified Independent             Mobility Comments: intermittently uses a cane when she feels shaky or balance didn't feel quite right ADLs Comments: drives and doesn't cook becuase sisters doesn't like her cooking; independent bathing dressing and other ADL/iADLs    OT Problem List: Decreased strength;Decreased activity tolerance;Impaired balance (sitting and/or standing);Decreased safety awareness;Pain   OT Treatment/Interventions: Self-care/ADL training;Balance training;Therapeutic exercise;Therapeutic activities;Energy conservation;Cognitive remediation/compensation;DME and/or AE instruction;Patient/family education      OT Goals(Current goals can be found in the care plan section)   Acute Rehab OT Goals Patient Stated Goal: to go home OT Goal Formulation: With patient Time For Goal Achievement: 01/31/24 Potential to Achieve Goals: Fair ADL Goals Pt Will Perform Grooming: sitting;with supervision Pt Will Perform Lower Body Dressing: with supervision;sit to/from stand Pt Will Transfer to Toilet: with supervision;ambulating Pt Will Perform Toileting - Clothing Manipulation and hygiene: with supervision;sit to/from stand   OT Frequency:  Min 2X/week       AM-PAC OT 6 Clicks Daily Activity     Outcome Measure Help from another person eating meals?: None Help from another person taking care of personal grooming?: A Little Help from another person toileting, which includes using toliet, bedpan, or  urinal?: A Lot Help from another person bathing (including washing, rinsing, drying)?: A Little Help from another person to put on and taking off regular upper body clothing?: A Little Help from another person to put on and taking off regular lower body clothing?: A Lot 6 Click Score: 17   End of Session Equipment Utilized During Treatment: Rolling walker (2 wheels) Nurse Communication: Mobility status;Patient requests pain meds  Activity Tolerance: Patient limited by pain Patient left: in bed;with call bell/phone within reach;with bed alarm set  OT Visit Diagnosis: Unsteadiness on feet (R26.81);Repeated falls (R29.6);Muscle weakness (generalized) (M62.81);History of falling (Z91.81);Pain Pain - Right/Left: Left Pain - part of body: Leg                Time: 8395-8369 OT Time Calculation (min): 26 min Charges:  OT General Charges $OT Visit: 1 Visit OT Evaluation $OT Eval Moderate Complexity: [address], [place], OTR/L , CBIS ascom [phone]  01/17/2024, 5:31 PM

## 2024-01-17 NOTE — ED Triage Notes (Signed)
 Pt presents for mechanical fall landing on right hip. No obvious deformity, shortening or rotation. Endorsing 9/10 pain. PMS intact. Did not strike head, no LOC.

## 2024-01-17 NOTE — Evaluation (Signed)
 Physical Therapy Evaluation Patient Details Name: Suzanne [name] MRN: [id number] DOB: 07-12-1950 Today's Date: 01/17/2024  History of Present Illness  Suzanne [name] is a 73 y.o. female with PMH of colon cancer s/p resection in 1989, anxiety, depression, fibromyalgia, IBS, osteoarthritis, HTN and neuropathy presented to ED after she sustained a fall at home.  Clinical Impression  Patient noted to be in supine position at PT arrival in room, for an initial PT evaluation due to a decline in functional status, with baseline mobility reported as modI with occasional cane use, and currently requiring modI for side steps at bedside with RW. The patient is A&O x 4, presenting with good willingness to work with PT and goals of going home.The patient resides in a house and lives with sisters with family/friend support. There are no steps to enter inside the residence. The overall clinical impression is that the patient presents with moderate mobility limitations secondary to fall only able to step laterally at bed side. Recommended skilled PT will address safety, mobility, and discharge planning.      If plan is discharge home, recommend the following: A little help with walking and/or transfers;A little help with bathing/dressing/bathroom;Help with stairs or ramp for entrance;Assist for transportation   Can travel by private vehicle        Equipment Recommendations Rolling walker (2 wheels)  Recommendations for Other Services       Functional Status Assessment Patient has had a recent decline in their functional status and demonstrates the ability to make significant improvements in function in a reasonable and predictable amount of time.     Precautions / Restrictions Precautions Precautions: Fall Restrictions Weight Bearing Restrictions Per Provider Order: No      Mobility  Bed Mobility Overal bed mobility: Needs Assistance Bed Mobility: Sidelying to Sit   Sidelying to sit:  Supervision, Contact guard assist            Transfers Overall transfer level: Needs assistance Equipment used: Rolling walker (2 wheels) Transfers: Sit to/from Stand Sit to Stand: Contact guard assist, Supervision           General transfer comment: few side steps at bedside    Ambulation/Gait                  Stairs            Wheelchair Mobility     Tilt Bed    Modified Rankin (Stroke Patients Only)       Balance Overall balance assessment: Needs assistance Sitting-balance support: No upper extremity supported Sitting balance-Leahy Scale: Good     Standing balance support: During functional activity, Reliant on assistive device for balance Standing balance-Leahy Scale: Good                               Pertinent Vitals/Pain Pain Assessment Pain Assessment: No/denies pain    Home Living Family/patient expects to be discharged to:: Private residence Living Arrangements: Other (Comment) (LIVES WITH TWO SISTERS) Available Help at Discharge: Family Type of Home: Apartment         Home Layout: One level Home Equipment: Rollator (4 wheels);Cane - quad      Prior Function Prior Level of Function : Independent/Modified Independent             Mobility Comments: intermittently uses a cane when she feels shaky or balance didn't feel quite right ADLs Comments: drives and doesn't cook becuase  sisters doesn't like her cooking; independent bathing dressing and other ADL/iADLs     Extremity/Trunk Assessment   Upper Extremity Assessment Upper Extremity Assessment: Generalized weakness    Lower Extremity Assessment Lower Extremity Assessment: Generalized weakness    Cervical / Trunk Assessment Cervical / Trunk Assessment: Normal  Communication   Communication Communication: No apparent difficulties    Cognition Arousal: Alert Behavior During Therapy: WFL for tasks assessed/performed   PT - Cognitive impairments:  No apparent impairments                         Following commands: Intact       Cueing Cueing Techniques: Verbal cues     General Comments      Exercises     Assessment/Plan    PT Assessment Patient needs continued PT services  PT Problem List Decreased strength;Decreased activity tolerance;Decreased balance;Decreased mobility;Decreased safety awareness       PT Treatment Interventions Gait training;Stair training;Functional mobility training;Therapeutic activities;Therapeutic exercise;Neuromuscular re-education;Balance training    PT Goals (Current goals can be found in the Care Plan section)  Acute Rehab PT Goals PT Goal Formulation: Patient unable to participate in goal setting    Frequency Min 2X/week     Co-evaluation               AM-PAC PT 6 Clicks Mobility  Outcome Measure Help needed turning from your back to your side while in a flat bed without using bedrails?: A Little Help needed moving from lying on your back to sitting on the side of a flat bed without using bedrails?: A Little Help needed moving to and from a bed to a chair (including a wheelchair)?: A Little Help needed standing up from a chair using your arms (e.g., wheelchair or bedside chair)?: A Lot Help needed to walk in hospital room?: Total Help needed climbing 3-5 steps with a railing? : Total 6 Click Score: 13    End of Session   Activity Tolerance: Patient tolerated treatment well Patient left: in bed Nurse Communication: Mobility status PT Visit Diagnosis: Other abnormalities of gait and mobility (R26.89);Muscle weakness (generalized) (M62.81);Difficulty in walking, not elsewhere classified (R26.2);Unsteadiness on feet (R26.81)    Time: 8395-8366 PT Time Calculation (min) (ACUTE ONLY): 29 min   Charges:   PT Evaluation $PT Eval Low Complexity: 1 Low   PT General Charges $$ ACUTE PT VISIT: 1 Visit         Sherlean Lesches DPT, PT    Sherlean A  Kinlie Janice 01/17/2024, 4:54 PM

## 2024-01-17 NOTE — Consult Note (Signed)
 " ORTHOPAEDIC CONSULTATION  REQUESTING PHYSICIAN: Kathrin Mignon DASEN, MD  Chief Complaint: Right pelvic pain  HPI: Suzanne [name] is a 73 y.o. female who complains of right pelvic pain after a fall earlier this morning in her home.  She had immediate pain and was unable to walk well.  She was brought to the emergency room where exam and x-rays including CT scan of the pelvis reveal a minimally displaced inferior and superior pubic ramus fracture on the right and possible small crack in the sacrum.  She has a history of injury in the tip of the sacrum in the past as well.  She is in significant pain and is unable to ambulate on her own.  She says there is a home with 2 sisters.  She will need hospitalization for PT and probable skilled nursing placement for short time.  Past Medical History:  Diagnosis Date   Anxiety    Cancer (HCC) 1989   Hx: of colon cancer   Depression    Fibromyalgia    Gallstones 05/29/2012   Lap chole 7/02   Headache(784.0)    Hx: of migraines   Hypercholesteremia    Hypertension    IBS (irritable bowel syndrome)    Neuropathy    Ovarian cyst    Hx: of    Pneumonia few yrs ago   Shortness of breath    Hx: of with panic attacks   Past Surgical History:  Procedure Laterality Date   BREAST BIOPSY Right 2009   benign   CHOLECYSTECTOMY N/A 07/29/2012   Procedure: LAPAROSCOPIC CHOLECYSTECTOMY WITH INTRAOPERATIVE CHOLANGIOGRAM;  Surgeon: Sherlean JINNY Laughter, MD;  Location: [facility] OR;  Service: General;  Laterality: N/A;   COLON RESECTION  1989   colon cancer   COLONOSCOPY WITH PROPOFOL  N/A 06/18/2016   Procedure: COLONOSCOPY WITH PROPOFOL ;  Surgeon: Vicci Gladis POUR, MD;  Location: [facility];  Service: Endoscopy;  Laterality: N/A;   colonscopy  every 5 yrs   DILATION AND CURETTAGE OF UTERUS      Hx: of over 30 years ago   LAPAROSCOPIC CHOLECYSTECTOMY     TOTAL ABDOMINAL HYSTERECTOMY  1996   Social History   Socioeconomic History   Marital status: Widowed     Spouse name: Not on file   Number of children: Not on file   Years of education: Not on file   Highest education level: Not on file  Occupational History   Not on file  Tobacco Use   Smoking status: Never   Smokeless tobacco: Never  Vaping Use   Vaping status: Never Used  Substance and Sexual Activity   Alcohol use: Not Currently    Comment: rarely   Drug use: No   Sexual activity: Yes  Other Topics Concern   Not on file  Social History Narrative   Not on file   Social Drivers of Health   Tobacco Use: Low Risk (01/17/2024)   Patient History    Smoking Tobacco Use: Never    Smokeless Tobacco Use: Never    Passive Exposure: Not on file  Financial Resource Strain: Not on file  Food Insecurity: Not on file  Transportation Needs: Not on file  Physical Activity: Not on file  Stress: Not on file  Social Connections: Not on file  Depression (EYV7-0): Not on file  Alcohol Screen: Not on file  Housing: Not on file  Utilities: Not on file  Health Literacy: Not on file   Family History  Problem Relation Age of Onset  Stomach cancer Father    Cancer Father    Cancer Sister    Breast cancer Paternal Aunt    Allergies[1] Prior to Admission medications  Medication Sig Start Date End Date Taking? Authorizing Provider  albuterol  (VENTOLIN  HFA) 108 (90 Base) MCG/ACT inhaler Inhale 2 puffs into the lungs every 4 (four) hours as needed.   Yes [provider]  busPIRone  (BUSPAR ) 10 MG tablet Take 10 mg by mouth 2 (two) times daily.   Yes [provider]  cetirizine (ZYRTEC) 10 MG tablet Take 10 mg by mouth at bedtime.   Yes [provider]  doxycycline  (VIBRAMYCIN ) 50 MG capsule Take 50 mg by mouth daily. 01/12/24  Yes [provider]  fenofibrate (TRICOR) 145 MG tablet Take 145 mg by mouth daily. 03/13/22  Yes [provider]  fluticasone  (FLONASE ) 50 MCG/ACT nasal spray Place 2 sprays into the nose daily as needed for allergies.    Yes  [provider]  gabapentin  (NEURONTIN ) 800 MG tablet Take 800 mg by mouth 3 (three) times daily.   Yes [provider]  metFORMIN  (GLUCOPHAGE ) 1000 MG tablet Take 1,000 mg by mouth daily. 11/10/23  Yes [provider]  rosuvastatin  (CRESTOR ) 20 MG tablet Take 20 mg by mouth at bedtime.   Yes [provider]  spironolactone  (ALDACTONE ) 50 MG tablet Take 100 mg by mouth 2 (two) times daily.   Yes [provider]  venlafaxine  XR (EFFEXOR -XR) 150 MG 24 hr capsule Take 150 mg by mouth 2 (two) times daily.   Yes [provider]  acetaminophen  (TYLENOL ) 500 MG tablet Take 1,500 mg by mouth 2 (two) times daily as needed for mild pain.  Patient not taking: Reported on 01/17/2024    [provider]  meclizine (ANTIVERT) 25 MG tablet Take 25 mg by mouth 3 (three) times daily as needed for dizziness.     [provider]  metFORMIN  (GLUCOPHAGE ) 500 MG tablet Take 500 mg by mouth daily. Patient not taking: Reported on 01/17/2024    [provider]  pravastatin (PRAVACHOL) 80 MG tablet Take 80 mg by mouth at bedtime.  Patient not taking: Reported on 01/17/2024    [provider]  triamterene-hydrochlorothiazide (MAXZIDE-25) 37.5-25 MG per tablet Take 1 tablet by mouth daily. Patient not taking: Reported on 01/17/2024    [provider]   CT Head Wo Contrast Result Date: 01/17/2024 EXAM: CT HEAD WITHOUT CONTRAST 01/17/2024 07:32:18 AM TECHNIQUE: CT of the head was performed without the administration of intravenous contrast. Automated exposure control, iterative reconstruction, and/or weight based adjustment of the mA/kV was utilized to reduce the radiation dose to as low as reasonably achievable. COMPARISON: None available. CLINICAL HISTORY: Head trauma, minor (Age >= 65y) FINDINGS: BRAIN AND VENTRICLES: No acute hemorrhage. No evidence of acute infarct. No hydrocephalus. No extra-axial collection. No mass effect  or midline shift. ORBITS: No acute abnormality. SINUSES: No acute abnormality. SOFT TISSUES AND SKULL: No acute soft tissue abnormality. No skull fracture. IMPRESSION: 1. No acute intracranial abnormality. Electronically signed by: Gilmore Molt 01/17/2024 07:38 AM EST RP Workstation: [id number]   CT PELVIS WO CONTRAST Result Date: 01/17/2024 EXAM: CT Pelvis, Without IV Contrast 01/17/2024 04:57:49 AM TECHNIQUE: Axial images were acquired through the pelvis without IV contrast. Reformatted images were reviewed. Automated exposure control, iterative reconstruction, and/or weight based adjustment of the mA/kV was utilized to reduce the radiation dose to as low as reasonably achievable. COMPARISON: None available. CLINICAL HISTORY: Hip trauma, fracture suspected, xray  done. FINDINGS: BONES: Horizontally oriented fracture of the medial aspect of the right superior pubic ramus, best seen on coronal reformations images 53 through 56. Nondisplaced fracture of the ventral cortex of the inferior pubic ramus, present on axial image 42 of series 3. Fracture of the pubic body demonstrated on the coronal and axial images. Slightly displaced fracture of the inferior aspect of the right hemisacrum, noted on image 19 of series 3. JOINTS: The right hip joint is intact. No dislocation. The joint spaces are normal. SOFT TISSUES: The soft tissues are unremarkable. INTRAPELVIC CONTENTS: Status post partial lower anterior resection, hysterectomy, and bilateral salpingo-oophorectomy. IMPRESSION: 1. Horizontally oriented fracture of the medial aspect of the right superior pubic ramus. 2. Nondisplaced fracture of the ventral cortex of the right inferior pubic ramus. 3. Fracture of the right pubic body. 4. Slightly displaced fracture of the inferior right hemisacrum. Electronically signed by: Evalene Coho MD 01/17/2024 05:31 AM EST RP Workstation: [id number]   DG Hip Unilat W or Wo Pelvis 2-3 Views Right Result Date:  01/17/2024 EXAM: 2 OR MORE VIEW(S) XRAY OF THE PELVIS AND RIGHT HIP 01/17/2024 02:45:44 AM COMPARISON: None available. CLINICAL HISTORY: Fall FINDINGS: BONES AND JOINTS: SI joints are symmetric. Question nondisplaced right pubic body fracture versus projection artifact. Consider CT for further evaluation. Bilateral hips demonstrate normal alignment. SOFT TISSUES: Surgical clips and enteric sutures in pelvis noted. IMPRESSION: 1. Question nondisplaced right pubic body fracture versus projection artifact; consider CT for further evaluation. Electronically signed by: Norman Gatlin MD 01/17/2024 02:50 AM EST RP Workstation: [id number]    Positive ROS: All other systems have been reviewed and were otherwise negative with the exception of those mentioned in the HPI and as above.  Physical Exam: General: Alert, no acute distress Cardiovascular: No pedal edema Respiratory: No cyanosis, no use of accessory musculature GI: No organomegaly, abdomen is soft and non-tender Skin: No lesions in the area of chief complaint Neurologic: Sensation intact distally Psychiatric: Patient is competent for consent with normal mood and affect Lymphatic: No axillary or cervical lymphadenopathy  MUSCULOSKELETAL: Patient is alert cooperative and oriented.  She is lying quietly in the bed.  She has tenderness to palpation of the right anterior pubic region.  The right hip has fairly good motion with much much pain.  The left hip is good motion without pain.  Neurovascular status in good but is good in both lower extremities and the skin is intact.  Spine and head and neck are unremarkable as is the spine.  Both lower extremities move well without pain.  Assessment: Minimally displaced the right pelvic fractures  Plan: Patient will need hospitalization for physical therapy and possible skilled nursing placement for a short time.    She may weight-bear as tolerated with a walker.    Kayla FORBES Pinal,  MD [phone]   01/17/2024 3:14 PM      [1]  Allergies Allergen Reactions   Contrast Media [Iodinated Contrast Media] Swelling   Latex Rash   "

## 2024-01-17 NOTE — ED Notes (Signed)
 Pt is quiet and comfortable. Assisted with finding her call light, putting up the bed rails and turning off her light. No other needs at this time

## 2024-01-17 NOTE — H&P (Signed)
 " History and Physical    Patient: Suzanne [name] FMW:[id number] DOB: 1950-08-18 DOA: 01/17/2024 DOS: the patient was seen and examined on 01/17/2024 PCP: Tobie Domino, MD  Patient coming from: Home.  Lives with sisters.  Uses cane intermittently  Chief Complaint:  Chief Complaint  Patient presents with   Fall   HPI: Suzanne [name] is a 73 y.o. female with PMH of colon cancer s/p resection in 1989, anxiety, depression, fibromyalgia, IBS, osteoarthritis, HTN and neuropathy presented to ED after she sustained a fall at home.  Patient reports starting around to pick up something of the table when she lost her balance and fell on the right side.  Initially landed on her right hip, and turning head slowly get down to the floor.  Felt pain in right hip pelvic area right away.  Unable to walk.  Currently rates the pain as severe.  Denies numbness or tingling in her legs denies prodrome such as chest pain, shortness of breath, palpitation or dizziness leading to this pain.  Denies LOC.  Denies any medication.  Denies fever, chills, cough, chest pain, palpitation, dizziness, GI or UTI symptoms.  Reports intermittent constipation that she managed with stool softener.  Patient denies smoking cigarettes, drinking alcohol or recreational drug use.  She is interested in cardiopulmonary cessation in event of sudden cardiopulmonary arrest.  In ED, stable vitals.  Basic labs including CMP and CBC without significant finding.  CT head without acute finding.  Pelvic x-ray inconclusive.  Pelvic CT showed right superior and inferior rami fracture, right pubic body fracture and slightly displaced fracture of the inferior right hemi-sacrum.  Admission requested for pain control and therapy evaluation.  Accepted patient to MedSurg floor.  Requested EDP to discuss with orthopedic surgery   Review of Systems: As mentioned in the history of present illness. All other systems reviewed and are negative. Past Medical  History:  Diagnosis Date   Anxiety    Cancer (HCC) 1989   Hx: of colon cancer   Depression    Fibromyalgia    Gallstones 05/29/2012   Lap chole 7/02   Headache(784.0)    Hx: of migraines   Hypercholesteremia    Hypertension    IBS (irritable bowel syndrome)    Neuropathy    Ovarian cyst    Hx: of    Pneumonia few yrs ago   Shortness of breath    Hx: of with panic attacks   Past Surgical History:  Procedure Laterality Date   BREAST BIOPSY Right 2009   benign   CHOLECYSTECTOMY N/A 07/29/2012   Procedure: LAPAROSCOPIC CHOLECYSTECTOMY WITH INTRAOPERATIVE CHOLANGIOGRAM;  Surgeon: Sherlean JINNY Laughter, MD;  Location: [facility] OR;  Service: General;  Laterality: N/A;   COLON RESECTION  1989   colon cancer   COLONOSCOPY WITH PROPOFOL  N/A 06/18/2016   Procedure: COLONOSCOPY WITH PROPOFOL ;  Surgeon: Vicci Gladis POUR, MD;  Location: [facility];  Service: Endoscopy;  Laterality: N/A;   colonscopy  every 5 yrs   DILATION AND CURETTAGE OF UTERUS      Hx: of over 30 years ago   LAPAROSCOPIC CHOLECYSTECTOMY     TOTAL ABDOMINAL HYSTERECTOMY  1996   Social History:  reports that she has never smoked. She has never used smokeless tobacco. She reports that she does not currently use alcohol. She reports that she does not use drugs.  Allergies[1]  Family History  Problem Relation Age of Onset   Stomach cancer Father    Cancer Father  Cancer Sister    Breast cancer Paternal Aunt     Prior to Admission medications  Medication Sig Start Date End Date Taking? Authorizing Provider  acetaminophen  (TYLENOL ) 500 MG tablet Take 1,500 mg by mouth 2 (two) times daily as needed for mild pain.     [provider]  albuterol  (VENTOLIN  HFA) 108 (90 Base) MCG/ACT inhaler Inhale 2 puffs into the lungs every 4 (four) hours as needed.    [provider]  busPIRone  (BUSPAR ) 10 MG tablet Take 10 mg by mouth 2 (two) times daily.    [provider]  cetirizine (ZYRTEC) 10 MG tablet  Take 10 mg by mouth at bedtime.    [provider]  fenofibrate (TRICOR) 145 MG tablet Take 145 mg by mouth daily. 03/13/22   [provider]  fluticasone  (FLONASE ) 50 MCG/ACT nasal spray Place 2 sprays into the nose daily as needed for allergies.     [provider]  gabapentin  (NEURONTIN ) 800 MG tablet Take 800 mg by mouth 3 (three) times daily.    [provider]  meclizine (ANTIVERT) 25 MG tablet Take 25 mg by mouth 3 (three) times daily as needed for dizziness.     [provider]  metFORMIN  (GLUCOPHAGE ) 500 MG tablet Take 500 mg by mouth daily.    [provider]  pravastatin (PRAVACHOL) 80 MG tablet Take 80 mg by mouth at bedtime.     [provider]  spironolactone  (ALDACTONE ) 50 MG tablet Take 50 mg by mouth 2 (two) times daily.    [provider]  triamterene-hydrochlorothiazide (MAXZIDE-25) 37.5-25 MG per tablet Take 1 tablet by mouth daily.    [provider]  venlafaxine  XR (EFFEXOR -XR) 150 MG 24 hr capsule Take 150 mg by mouth 2 (two) times daily.    [provider]    Physical Exam: Vitals:   01/17/24 0212 01/17/24 0509 01/17/24 0830 01/17/24 0848  BP: (!) 128/92 (!) 107/56 100/86   Pulse: 83 85 85   Resp: 18 18    Temp: 98.3 F (36.8 C) 98.2 F (36.8 C)  98.9 F (37.2 C)  TempSrc: Oral Oral  Oral  SpO2: 100% 98%     GENERAL: No apparent distress.  Nontoxic. HEENT: MMM.  Vision and hearing grossly intact.  NECK: Supple.  No apparent JVD.  RESP:  No IWOB.  Fair aeration bilaterally. CVS:  RRR. Heart sounds normal.  ABD/GI/GU: BS+. Abd soft, NTND.  MSK/EXT:   No apparent deformity. Moves extremities but limited by pain and right hip. No edema.  SKIN: no apparent skin lesion or wound NEURO: Awake and alert. Oriented appropriately.  No apparent focal neuro deficit. PSYCH: Calm. Normal affect.  Data Reviewed:   Assessment and Plan: Accidental fall at home-mechanical fall.  No  prodromes CT head without acute finding. Multiple pelvic fracture-pelvic CT as above -Multimodal pain control with scheduled Tylenol , as needed oxycodone  and Dilaudid  -Bowel regimen -Follow orthopedic surgery recommendation -Check vitamin D  level. -PT/OT  Essential hypertension: Normotensive but soft - Hold home Aldactone   Anxiety, depression, fibromyalgia, neuropathy: Stable - Continue home  History of colon cancer s/p resection in 1989-noted    Advance Care Planning:   Code Status: Full Code -discussed with patient.  Consults: Orthopedic surgery  Family Communication: None at bedside  Severity of Illness: The appropriate patient status for this patient is INPATIENT. Inpatient status is judged to be reasonable and necessary in order to provide the required intensity of service to ensure the  patient's safety. The patient's presenting symptoms, physical exam findings, and initial radiographic and laboratory data in the context of their chronic comorbidities is felt to place them at high risk for further clinical deterioration. Furthermore, it is not anticipated that the patient will be medically stable for discharge from the hospital within 2 midnights of admission.   * I certify that at the point of admission it is my clinical judgment that the patient will require inpatient hospital care spanning beyond 2 midnights from the point of admission due to high intensity of service, high risk for further deterioration and high frequency of surveillance required.*  Author: Mignon ONEIDA Bump, MD 01/17/2024 11:36 AM  For on call review [email or web address].      [1]  Allergies Allergen Reactions   Contrast Media [Iodinated Contrast Media] Swelling   Latex Rash   "

## 2024-01-17 NOTE — ED Provider Notes (Signed)
 "  [facility] Provider Note    Event Date/Time   First MD Initiated Contact with Patient 01/17/24 [phone]     (approximate)   History   Fall   HPI  Suzanne [name] is a 73 y.o. female   Past medical history of fibromyalgia, IBS, anxiety depression, hypertension hyperlipidemia presents to Emergency Department with mechanical slip and fall and pelvic pain.  Fell onto the right side of her buttock.  Struck the side of her head as well.  No blood thinner or loss of consciousness.  Was not able to get up due to pelvic pain.  No other acute traumatic injuries noted by patient nor acute medical complaints.   External Medical Documents Reviewed: Previous outpatient notes      Physical Exam   Triage Vital Signs: ED Triage Vitals  Encounter Vitals Group     BP 01/17/24 0212 (!) 128/92     Girls Systolic BP Percentile --      Girls Diastolic BP Percentile --      Boys Systolic BP Percentile --      Boys Diastolic BP Percentile --      Pulse Rate 01/17/24 0212 83     Resp 01/17/24 0212 18     Temp 01/17/24 0212 98.3 F (36.8 C)     Temp Source 01/17/24 0212 Oral     SpO2 01/17/24 0212 100 %     Weight --      Height --      Head Circumference --      Peak Flow --      Pain Score 01/17/24 0213 9     Pain Loc --      Pain Education --      Exclude from Growth Chart --     Most recent vital signs: Vitals:   01/17/24 0212 01/17/24 0509  BP: (!) 128/92 (!) 107/56  Pulse: 83 85  Resp: 18 18  Temp: 98.3 F (36.8 C) 98.2 F (36.8 C)  SpO2: 100% 98%    General: Awake, no distress.  CV:  Good peripheral perfusion.  Resp:  Normal effort.  Abd:  No distention.  Other:  Right sided hip pain to palpation.  Unable to range bilateral hips due to pain.  Neurovascular intact and no distal evidence of trauma to the bilateral lower extremities.  Able to range both upper extremities.  Torso abdomen and back palpated with no significant pain.  Neck supple full  range of motion.  No obvious signs of head trauma.  Clear lungs.   ED Results / Procedures / Treatments   Labs (all labs ordered are listed, but only abnormal results are displayed) Labs Reviewed  CBC WITH DIFFERENTIAL/PLATELET - Abnormal; Notable for the following components:      Result Value   RBC 3.77 (*)    MCV 101.3 (*)    Abs Immature Granulocytes 0.27 (*)    All other components within normal limits  COMPREHENSIVE METABOLIC PANEL WITH GFR - Abnormal; Notable for the following components:   Glucose, Bld 105 (*)    Total Protein 6.4 (*)    Alkaline Phosphatase 35 (*)    All other components within normal limits     I ordered and reviewed the above labs they are notable for cell counts electrolytes unremarkable.    RADIOLOGY I independently reviewed and interpreted CT of the head and see no large bleeding or midline shift I also reviewed radiologist's formal read.  PROCEDURES:  Critical Care performed: No  Procedures   MEDICATIONS ORDERED IN ED: Medications  morphine  (PF) 2 MG/ML injection 2 mg (2 mg Intravenous Given 01/17/24 9373)    External physician / consultants:  I spoke with orthopedist Dr. Kayla Pinal regarding care plan for this patient.   IMPRESSION / MDM / ASSESSMENT AND PLAN / ED COURSE  I reviewed the triage vital signs and the nursing notes.                                Patient's presentation is most consistent with acute presentation with potential threat to life or bodily function.  Differential diagnosis includes, but is not limited to, blunt traumatic injury from mechanical fall leading to pelvic injury, hip or pelvic fractures, skull fracture ICH concussion    MDM:   Mechanical slip and fall leading to multiple pelvis fractures.  Fortunately neurovascular intact on examination, no significant head trauma on examination but given head strike and age CT head performed fortunately showed no acute injuries.  Given the multiple  pelvic fractures inability to walk, plan will be for admission for pain control, orthopedics Dr. Kayla Pinal consulted to evaluate further for pelvic injury and treatment options.  Admission.        FINAL CLINICAL IMPRESSION(S) / ED DIAGNOSES   Final diagnoses:  Fall, initial encounter  Multiple closed fractures of pelvis without disruption of pelvic ring, initial encounter (HCC)  Injury of head, initial encounter     Rx / DC Orders   ED Discharge Orders     None        Note:  This document was prepared using Dragon voice recognition software and may include unintentional dictation errors.    Cyrena Mylar, MD 01/17/24 [phone]  "

## 2024-01-18 DIAGNOSIS — S32511A Fracture of superior rim of right pubis, initial encounter for closed fracture: Secondary | ICD-10-CM | POA: Diagnosis not present

## 2024-01-18 LAB — CBC
HCT: 38.2 % (ref 36.0–46.0)
Hemoglobin: 12.6 g/dL (ref 12.0–15.0)
MCH: 33.3 pg (ref 26.0–34.0)
MCHC: 33 g/dL (ref 30.0–36.0)
MCV: 101.1 fL — ABNORMAL HIGH (ref 80.0–100.0)
Platelets: 257 K/uL (ref 150–400)
RBC: 3.78 MIL/uL — ABNORMAL LOW (ref 3.87–5.11)
RDW: 12.4 % (ref 11.5–15.5)
WBC: 6.6 K/uL (ref 4.0–10.5)
nRBC: 0 % (ref 0.0–0.2)

## 2024-01-18 LAB — BASIC METABOLIC PANEL WITH GFR
Anion gap: 10 (ref 5–15)
BUN: 22 mg/dL (ref 8–23)
CO2: 28 mmol/L (ref 22–32)
Calcium: 9.2 mg/dL (ref 8.9–10.3)
Chloride: 102 mmol/L (ref 98–111)
Creatinine, Ser: 0.82 mg/dL (ref 0.44–1.00)
GFR, Estimated: >60 mL/min
Glucose, Bld: 141 mg/dL — ABNORMAL HIGH (ref 70–99)
Potassium: 4.1 mmol/L (ref 3.5–5.1)
Sodium: 141 mmol/L (ref 135–145)

## 2024-01-18 LAB — VITAMIN D 25 HYDROXY (VIT D DEFICIENCY, FRACTURES): Vit D, 25-Hydroxy: 12.1 ng/mL — ABNORMAL LOW (ref 30–100)

## 2024-01-18 MED ORDER — HYDROMORPHONE HCL 1 MG/ML IJ SOLN
0.5000 mg | INTRAMUSCULAR | Status: DC | PRN
  Administered 2024-01-19 – 2024-01-21 (×3): 0.5 mg via INTRAVENOUS
  Filled 2024-01-18 (×3): qty 0.5

## 2024-01-18 MED ORDER — LORATADINE 10 MG PO TABS
10.0000 mg | ORAL_TABLET | Freq: Every day | ORAL | Status: DC
  Administered 2024-01-18 – 2024-01-21 (×4): 10 mg via ORAL
  Filled 2024-01-18 (×4): qty 1

## 2024-01-18 MED ORDER — SENNOSIDES-DOCUSATE SODIUM 8.6-50 MG PO TABS
2.0000 | ORAL_TABLET | Freq: Two times a day (BID) | ORAL | Status: DC
  Administered 2024-01-18 – 2024-01-20 (×5): 2 via ORAL
  Filled 2024-01-18 (×5): qty 2

## 2024-01-18 MED ORDER — GABAPENTIN 400 MG PO CAPS
800.0000 mg | ORAL_CAPSULE | Freq: Three times a day (TID) | ORAL | Status: DC
  Administered 2024-01-18 – 2024-01-21 (×9): 800 mg via ORAL
  Filled 2024-01-18 (×9): qty 2

## 2024-01-18 MED ORDER — VITAMIN D (ERGOCALCIFEROL) 1.25 MG (50000 UNIT) PO CAPS
50000.0000 [IU] | ORAL_CAPSULE | ORAL | Status: DC
  Administered 2024-01-18: 50000 [IU] via ORAL
  Filled 2024-01-18: qty 1

## 2024-01-18 MED ORDER — CALCIUM CARBONATE 1250 (500 CA) MG PO TABS
1.0000 | ORAL_TABLET | Freq: Two times a day (BID) | ORAL | Status: DC
  Administered 2024-01-18 – 2024-01-21 (×6): 1250 mg via ORAL
  Filled 2024-01-18 (×7): qty 1

## 2024-01-18 MED ORDER — SENNOSIDES-DOCUSATE SODIUM 8.6-50 MG PO TABS: 1.0000 | ORAL_TABLET | Freq: Two times a day (BID) | ORAL | Status: DC

## 2024-01-18 MED ORDER — SPIRONOLACTONE 25 MG PO TABS
100.0000 mg | ORAL_TABLET | Freq: Two times a day (BID) | ORAL | Status: DC
  Administered 2024-01-18 – 2024-01-21 (×7): 100 mg via ORAL
  Filled 2024-01-18 (×7): qty 4

## 2024-01-18 MED ORDER — OXYCODONE HCL 5 MG PO TABS
10.0000 mg | ORAL_TABLET | Freq: Four times a day (QID) | ORAL | Status: DC | PRN
  Administered 2024-01-18 (×2): 10 mg via ORAL
  Filled 2024-01-18 (×5): qty 2

## 2024-01-18 MED ORDER — ALBUTEROL SULFATE (2.5 MG/3ML) 0.083% IN NEBU: 2.5000 mg | INHALATION_SOLUTION | RESPIRATORY_TRACT | Status: DC | PRN

## 2024-01-18 MED ORDER — FLUTICASONE PROPIONATE 50 MCG/ACT NA SUSP
2.0000 | Freq: Every day | NASAL | Status: DC
  Administered 2024-01-20: 2 via NASAL
  Filled 2024-01-18: qty 16

## 2024-01-18 NOTE — Progress Notes (Addendum)
 Pt is complaining of 10/10 pain from turning left and right side and is requiring maximum assistance. MD Shona made aware.  Update 0408: See new order.

## 2024-01-18 NOTE — Plan of Care (Signed)
  Problem: Education: Goal: Knowledge of General Education information will improve Description: Including pain rating scale, medication(s)/side effects and non-pharmacologic comfort measures Outcome: Progressing   Problem: Clinical Measurements: Goal: Respiratory complications will improve Outcome: Progressing   Problem: Clinical Measurements: Goal: Cardiovascular complication will be avoided Outcome: Progressing   Problem: Nutrition: Goal: Adequate nutrition will be maintained Outcome: Progressing   Problem: Elimination: Goal: Will not experience complications related to urinary retention Outcome: Progressing   Problem: Pain Managment: Goal: General experience of comfort will improve and/or be controlled Outcome: Progressing   Problem: Safety: Goal: Ability to remain free from injury will improve Outcome: Progressing

## 2024-01-18 NOTE — Progress Notes (Signed)
 Subjective:   The patient a is in her hospital bed and lying comfortably.  She has been out of bed in a chair.  Her hemoglobin is stable at 12.6.  She had moderate pain.  She does not want to go to skilled nursing but based on her current pain level this may be necessary. She may be ambulatory with a walker weightbearing as tolerated.    Patient reports pain as moderate.  Objective:   VITALS:   Vitals:   01/18/24 0502 01/18/24 0834  BP: 121/64 124/72  Pulse: 88 82  Resp: 17 17  Temp: 98.3 F (36.8 C) 98.1 F (36.7 C)  SpO2: 94% 97%    Neurologically intact Dorsiflexion/Plantar flexion intact  LABS Recent Labs    01/17/24 0216 01/18/24 0425  HGB 12.6 12.6  HCT 38.2 38.2  WBC 7.9 6.6  PLT 299 257    Recent Labs    01/17/24 0216 01/18/24 0425  NA 138 141  K 4.4 4.1  BUN 23 22  CREATININE 0.85 0.82  GLUCOSE 105* 141*    No results for input(s): LABPT, INR in the last 72 hours.   Assessment/Plan:      Advance diet Up with therapy Discharge to SNF probably.  Discharge on 81 g twice daily for 6 weeks Return to my office 2 weeks after discharge Weightbearing as tolerated with a walker

## 2024-01-18 NOTE — Progress Notes (Addendum)
 Physical Therapy Treatment Patient Details Name: MELONI HINZ MRN: [id number] DOB: 1950-04-30 Today's Date: 01/18/2024   History of Present Illness ANDREAH GOHEEN is a 73 y.o. female with PMH of colon cancer s/p resection in 1989, anxiety, depression, fibromyalgia, IBS, osteoarthritis, HTN and neuropathy presented to ED after she sustained a fall at home.    PT Comments  Patient seen for PT session focused on functional standing with weight shift with trunk support. Patient required min/modA  for few steps to transfer at bedside and used +1 hand held with trunk support for weight shift. Tolerated session well with mild signs of exertion or distress. Vitals remained stable during activity. Main limiting factors today were pain. Interventions aimed at improving mobility. Patient shows good potential to make progress with continued acute level rehab. Patient continues to demonstrate moderate activity restrictions and poor tolerance for progressive mobility. Continued skilled PT recommended to progress toward functional goals and support discharge readiness. Pt making good progress toward goals, will continue to follow POC. Discharge recommendation remains appropriate     If plan is discharge home, recommend the following: A little help with walking and/or transfers;A little help with bathing/dressing/bathroom;Help with stairs or ramp for entrance;Assist for transportation   Can travel by private vehicle        Equipment Recommendations  Rolling walker (2 wheels)    Recommendations for Other Services       Precautions / Restrictions Precautions Precautions: Fall Restrictions Weight Bearing Restrictions Per Provider Order: Yes RLE Weight Bearing Per Provider Order: Weight bearing as tolerated     Mobility  Bed Mobility Overal bed mobility: Needs Assistance Bed Mobility: Supine to Sit   Sidelying to sit: Supervision, Contact guard assist Supine to sit: Min assist     General  bed mobility comments: increase time need assistance to RLE    Transfers Overall transfer level: Needs assistance Equipment used: 1 person hand held assist Transfers: Sit to/from Stand, Bed to chair/wheelchair/BSC Sit to Stand: Min assist   Step pivot transfers: Min assist       General transfer comment: hand over hand supporting trunk with assitance with weight shift to support limb transition    Ambulation/Gait                   Stairs             Wheelchair Mobility     Tilt Bed    Modified Rankin (Stroke Patients Only)       Balance Overall balance assessment: Needs assistance Sitting-balance support: No upper extremity supported Sitting balance-Leahy Scale: Good     Standing balance support: During functional activity, Reliant on assistive device for balance Standing balance-Leahy Scale: Good                              Communication Communication Communication: No apparent difficulties  Cognition Arousal: Alert Behavior During Therapy: WFL for tasks assessed/performed   PT - Cognitive impairments: No apparent impairments                         Following commands: Intact      Cueing Cueing Techniques: Verbal cues  Exercises      General Comments        Pertinent Vitals/Pain Pain Assessment Pain Assessment: No/denies pain Faces Pain Scale: Hurts whole lot Pain Location: L LE Pain Descriptors / Indicators: Aching, Discomfort Pain Intervention(s):  Limited activity within patient's tolerance, Monitored during session, Repositioned    Home Living                          Prior Function            PT Goals (current goals can now be found in the care plan section) Acute Rehab PT Goals PT Goal Formulation: Patient unable to participate in goal setting    Frequency    Min 2X/week      PT Plan      Co-evaluation              AM-PAC PT 6 Clicks Mobility   Outcome Measure   Help needed turning from your back to your side while in a flat bed without using bedrails?: A Little Help needed moving from lying on your back to sitting on the side of a flat bed without using bedrails?: A Little Help needed moving to and from a bed to a chair (including a wheelchair)?: A Little Help needed standing up from a chair using your arms (e.g., wheelchair or bedside chair)?: A Lot Help needed to walk in hospital room?: A Lot Help needed climbing 3-5 steps with a railing? : Total 6 Click Score: 14    End of Session   Activity Tolerance: Patient tolerated treatment well Patient left: in chair;with call bell/phone within reach;with chair alarm set Nurse Communication: Mobility status PT Visit Diagnosis: Other abnormalities of gait and mobility (R26.89);Muscle weakness (generalized) (M62.81);Difficulty in walking, not elsewhere classified (R26.2);Unsteadiness on feet (R26.81)     Time: 9090-9072 PT Time Calculation (min) (ACUTE ONLY): 18 min  Charges:    $Therapeutic Activity: 8-22 mins PT General Charges $$ ACUTE PT VISIT: 1 Visit                     Sherlean Lesches DPT, PT     Sherlean A Leotis Isham 01/18/2024, 11:32 AM

## 2024-01-18 NOTE — Progress Notes (Signed)
 " PROGRESS NOTE  Suzanne [name] FMW:[id number] DOB: [birth year]   PCP: Tobie Domino, MD  Patient is from: Home.  Lives with sisters.  Uses cane intermittently   DOA: 01/17/2024 LOS: 1  Chief complaints Chief Complaint  Patient presents with   Fall     Brief Narrative / Interim history: 73 y.o. female with PMH of colon cancer s/p resection in 1989, anxiety, depression, fibromyalgia, IBS, osteoarthritis, HTN and neuropathy presented to ED after she sustained a fall at home, and admitted with pelvic fracture. CT showed right superior and inferior rami fracture, right pubic body fracture and slightly displaced fracture of the inferior right hemi-sacrum.  Orthopedic surgery consulted and recommended nonoperative management.  Therapy recommended SNF.  Subjective: Seen and examined earlier this morning.  No major events overnight or this morning.  Reports having severe pain last night.  She says IV Dilaudid  to help with the pain.  She currently rates her pain 5/10.  No other complaints.   Assessment and plan: Accidental fall at home-mechanical fall.  No prodromes CT head without acute finding. Multiple pelvic fracture-pelvic CT as above -Ortho recommended nonoperative management.  WBAT, therapy and pain control -Multimodal pain control with scheduled Tylenol  -Oxycodone  5 mg every 6 hours as needed for moderate pain -Oxycodone  10 mg every 6 hours as needed severe pain -IV Dilaudid  0.5 mg every 3 hours as needed for breakthrough pain -Resume home gabapentin . -Bowel regimen-Senokot twice daily and MiraLAX  as needed -Follow orthopedic surgery recommendation -Vitamin D  level 12.1.  Vitamin D  and calcium  supplementation -PT/OT-recommended SNF.   Essential hypertension: Normotensive but soft.  Confirms taking Aldactone  100 mg twice daily at home -Continue home Aldactone    Anxiety, depression, fibromyalgia, neuropathy: Stable - Continue home.  Resume gabapentin .  Vitamin D   deficiency -Vitamin D  supplementation as above   History of colon cancer s/p resection in 1989-noted  Body mass index is 28 kg/m.           DVT prophylaxis:  enoxaparin  (LOVENOX ) injection 40 mg Start: 01/17/24 2200  Code Status: Full code Family Communication: None at bedside Level of care: Med-Surg Status is: Inpatient Remains inpatient appropriate because: Accidental fall at home and pelvic fracture   Final disposition: SNF   35 minutes with more than 50% spent in reviewing records, counseling patient/family and coordinating care.  Consultants:  Orthopedic surgery  Procedures: None  Microbiology summarized: None  Objective: Vitals:   01/17/24 1800 01/17/24 2124 01/18/24 0502 01/18/24 0834  BP: 123/62 123/65 121/64 124/72  Pulse: 90 86 88 82  Resp: 20 20 17 17   Temp: 98 F (36.7 C) 98.7 F (37.1 C) 98.3 F (36.8 C) 98.1 F (36.7 C)  TempSrc: Oral Oral    SpO2: 100% 100% 94% 97%  Weight:  78.7 kg    Height:  5' 6 (1.676 m)      Examination:  GENERAL: No apparent distress.  Nontoxic.  Sitting on bedside chair. HEENT: MMM.  Vision and hearing grossly intact.  NECK: Supple.  No apparent JVD.  RESP:  No IWOB.  Fair aeration bilaterally. CVS:  RRR. Heart sounds normal.  ABD/GI/GU: BS+. Abd soft, NTND.  MSK/EXT:   No apparent deformity. Moves extremities but limited by pain and right hip. No edema.  SKIN: no apparent skin lesion or wound NEURO: Awake and alert. Oriented appropriately.  No apparent focal neuro deficit. PSYCH: Calm. Normal affect.   Sch Meds:  Scheduled Meds:  acetaminophen   975 mg Oral Q6H WA   busPIRone   10 mg Oral BID   doxycycline   50 mg Oral Daily   enoxaparin  (LOVENOX ) injection  40 mg Subcutaneous Q24H   fluticasone   2 spray Each Nare Daily   gabapentin   800 mg Oral TID   loratadine   10 mg Oral Daily   metFORMIN   1,000 mg Oral Q lunch   rosuvastatin   20 mg Oral QHS   senna-docusate  1 tablet Oral Daily   spironolactone    100 mg Oral BID   venlafaxine  XR  150 mg Oral BID   Continuous Infusions: PRN Meds:.albuterol , HYDROmorphone  (DILAUDID ) injection, ondansetron  **OR** ondansetron  (ZOFRAN ) IV, oxyCODONE , oxyCODONE , polyethylene glycol  Antimicrobials: Anti-infectives (From admission, onward)    Start     Dose/Rate Route Frequency Ordered Stop   01/17/24 1300  doxycycline  (VIBRAMYCIN ) 50 MG capsule 50 mg        50 mg Oral Daily 01/17/24 1134          I have personally reviewed the following labs and images: CBC: Recent Labs  Lab 01/17/24 0216 01/18/24 0425  WBC 7.9 6.6  NEUTROABS 3.3  --   HGB 12.6 12.6  HCT 38.2 38.2  MCV 101.3* 101.1*  PLT 299 257   BMP &GFR Recent Labs  Lab 01/17/24 0216 01/18/24 0425  NA 138 141  K 4.4 4.1  CL 103 102  CO2 26 28  GLUCOSE 105* 141*  BUN 23 22  CREATININE 0.85 0.82  CALCIUM  9.4 9.2   Estimated Creatinine Clearance: 65.7 mL/min (by C-G formula based on SCr of 0.82 mg/dL). Liver & Pancreas: Recent Labs  Lab 01/17/24 0216  AST 26  ALT 19  ALKPHOS 35*  BILITOT 0.2  PROT 6.4*  ALBUMIN 4.3   No results for input(s): LIPASE, AMYLASE in the last 168 hours. No results for input(s): AMMONIA in the last 168 hours. Diabetic: No results for input(s): HGBA1C in the last 72 hours. No results for input(s): GLUCAP in the last 168 hours. Cardiac Enzymes: Recent Labs  Lab 01/17/24 0215  CKTOTAL 123   No results for input(s): PROBNP in the last 8760 hours. Coagulation Profile: No results for input(s): INR, PROTIME in the last 168 hours. Thyroid  Function Tests: No results for input(s): TSH, T4TOTAL, FREET4, T3FREE, THYROIDAB in the last 72 hours. Lipid Profile: No results for input(s): CHOL, HDL, LDLCALC, TRIG, CHOLHDL, LDLDIRECT in the last 72 hours. Anemia Panel: No results for input(s): VITAMINB12, FOLATE, FERRITIN, TIBC, IRON, RETICCTPCT in the last 72 hours. Urine analysis:    Component  Value Date/Time   COLORURINE Straw 09/18/2011 1521   APPEARANCEUR Clear 09/18/2011 1521   LABSPEC 1.008 09/18/2011 1521   PHURINE 7.0 09/18/2011 1521   GLUCOSEU Negative 09/18/2011 1521   HGBUR 1+ 09/18/2011 1521   BILIRUBINUR Negative 09/18/2011 1521   KETONESUR Negative 09/18/2011 1521   PROTEINUR Negative 09/18/2011 1521   NITRITE Negative 09/18/2011 1521   LEUKOCYTESUR Negative 09/18/2011 1521   Sepsis Labs: Invalid input(s): PROCALCITONIN, LACTICIDVEN  Microbiology: No results found for this or any previous visit (from the past 240 hours).  Radiology Studies: No results found.    Suzanne [name] [facility]  If 7PM-7AM, please contact night-coverage [email or web address] 01/18/2024, 12:19 PM   "

## 2024-01-18 NOTE — Plan of Care (Signed)

## 2024-01-19 DIAGNOSIS — E559 Vitamin D deficiency, unspecified: Secondary | ICD-10-CM | POA: Insufficient documentation

## 2024-01-19 DIAGNOSIS — S32511A Fracture of superior rim of right pubis, initial encounter for closed fracture: Secondary | ICD-10-CM | POA: Diagnosis not present

## 2024-01-19 MED ORDER — MAGNESIUM CITRATE PO SOLN
1.0000 | Freq: Every day | ORAL | Status: DC | PRN
  Administered 2024-01-20: 1 via ORAL
  Filled 2024-01-19 (×2): qty 296

## 2024-01-19 NOTE — Progress Notes (Signed)
 Occupational Therapy Treatment Patient Details Name: Suzanne [name] MRN: [id number] DOB: [birth year] Today's Date: 01/19/2024   History of present illness Suzanne [name] is a 73 y.o. female with PMH of colon cancer s/p resection in 1989, anxiety, depression, fibromyalgia, IBS, osteoarthritis, HTN and neuropathy presented to ED after she sustained a fall at home.   OT comments  Suzanne [name] seen for OT additional OT treatment on this afternoon, to progress functional mobility and education on safe use of the Stedy STS lift for longer functional transfers. Of note, pt does very well with [name] lift for transfer to/from room commode. Recommend pt DC with STS lift upon DC for home use if pt elects to DC home vs. STR. Upon arrival to room pt supine in bed, agreeable to OT Tx session. OT facilitated ADL management with education and assistance as described below. See ADL section for additional details regarding occupational performance. Pt continues to be functionally limited by increased pain with mobility, decreased activity tolerance, and generalized weakness. Pt return verbalizes understanding of education provided t/o session. Pt is progressing toward OT goals and continues to benefit from skilled OT services to maximize return to PLOF and minimize risk of future falls, injury, and readmission. Will continue to follow POC as written. Discharge recommendation remains appropriate at this time.       If plan is discharge home, recommend the following:  A lot of help with walking and/or transfers;A lot of help with bathing/dressing/bathroom;Assistance with cooking/housework;Direct supervision/assist for medications management;Assist for transportation   Equipment Recommendations  Tub/shower bench;Other (comment) (If pt elects for home vs. STR she will benefit from a STEDY lift for home use.)    Recommendations for Other Services      Precautions / Restrictions Precautions Precautions:  Fall Recall of Precautions/Restrictions: Intact Restrictions Weight Bearing Restrictions Per Provider Order: Yes RLE Weight Bearing Per Provider Order: Weight bearing as tolerated       Mobility Bed Mobility Overal bed mobility: Needs Assistance Bed Mobility: Supine to Sit   Sidelying to sit: Supervision, HOB elevated, Used rails Supine to sit: Supervision, HOB elevated, Used rails          Transfers Overall transfer level: Needs assistance Equipment used: Rolling walker (2 wheels) Transfers: Sit to/from Stand, Bed to chair/wheelchair/BSC Sit to Stand: Supervision     Step pivot transfers: Contact guard assist, Supervision     General transfer comment: Does very well using Stedy STS to get on/off room commode. Transfer via Lift Equipment: Stedy   Balance Overall balance assessment: Needs assistance Sitting-balance support: No upper extremity supported Sitting balance-Leahy Scale: Good     Standing balance support: During functional activity, Reliant on assistive device for balance Standing balance-Leahy Scale: Good                             ADL either performed or assessed with clinical judgement   ADL Overall ADL's : Needs assistance/impaired     Grooming: Sitting;Set up;Supervision/safety               Lower Body Dressing: Set up;Supervision/safety;Bed level Lower Body Dressing Details (indicate cue type and reason): Able to adjust bilat socks while semi-supine in bed. Anticipat MOD A for LB ADL management from STS. Toilet Transfer: BSC/3in1;Minimal assistance;Contact guard assist;Rolling walker (2 wheels) Toilet Transfer Details (indicate cue type and reason): Able to use STS lift for functional transfer to room commode with SUPERVISION for safety and  assist with mgt of lift equipment. Toileting- Clothing Manipulation and Hygiene: Modified independent;Sitting/lateral lean Toileting - Clothing Manipulation Details (indicate cue type and  reason): Performs peri-care independently with seated lateral leans and use of grab bar for support.     Functional mobility during ADLs: Set up;Supervision/safety;Rolling walker (2 wheels) General ADL Comments: After using Camie Steady lift to t/f to/from room commode, pt agreeable to attempting further functional mobility this PM. She is able to walk ~3 feet forward then back up to recliner with SUPERVISION for safety. Min cueing for hand placement/technique during initital STS. good recall/carryover from previous session appreciated.    Extremity/Trunk Assessment Upper Extremity Assessment Upper Extremity Assessment: Generalized weakness   Lower Extremity Assessment Lower Extremity Assessment: Generalized weakness        Vision Baseline Vision/History: 1 Wears glasses Patient Visual Report: No change from baseline     Perception     Praxis     Communication Communication Communication: No apparent difficulties   Cognition Arousal: Alert Behavior During Therapy: WFL for tasks assessed/performed Cognition: No apparent impairments                               Following commands: Intact        Cueing   Cueing Techniques: Verbal cues  Exercises Other Exercises Other Exercises: OT facilitated ADL management with education and assistance as described above.    Shoulder Instructions       General Comments      Pertinent Vitals/ Pain       Pain Assessment Pain Assessment: Faces Pain Score: 6  Faces Pain Scale: Hurts whole lot Pain Location: L LE with mobility Pain Descriptors / Indicators: Aching, Discomfort, Grimacing, Sore Pain Intervention(s): Limited activity within patient's tolerance, Monitored during session, Repositioned  Home Living                                          Prior Functioning/Environment              Frequency  Min 2X/week        Progress Toward Goals  OT Goals(current goals can now be found in  the care plan section)  Progress towards OT goals: Progressing toward goals  Acute Rehab OT Goals Patient Stated Goal: to go home OT Goal Formulation: With patient Time For Goal Achievement: 01/31/24 Potential to Achieve Goals: Fair  Plan      Co-evaluation                 AM-PAC OT 6 Clicks Daily Activity     Outcome Measure   Help from another person eating meals?: None Help from another person taking care of personal grooming?: A Little Help from another person toileting, which includes using toliet, bedpan, or urinal?: A Lot Help from another person bathing (including washing, rinsing, drying)?: A Lot Help from another person to put on and taking off regular upper body clothing?: A Little Help from another person to put on and taking off regular lower body clothing?: A Lot 6 Click Score: 16    End of Session Equipment Utilized During Treatment: Rolling walker (2 wheels);Other (comment) (STEDY for longer functional distances.)  OT Visit Diagnosis: Unsteadiness on feet (R26.81);Repeated falls (R29.6);Muscle weakness (generalized) (M62.81);History of falling (Z91.81);Pain Pain - Right/Left: Left Pain - part of body: Leg  Activity Tolerance Patient tolerated treatment well   Patient Left in bed;with call bell/phone within reach;with bed alarm set   Nurse Communication Mobility status;Patient requests pain meds        Time: 1350-1420 OT Time Calculation (min): 30 min  Charges: OT General Charges $OT Visit: 1 Visit OT Treatments $Self Care/Home Management : 23-37 mins  Jhonny Pelton, M.S., OTR/L 01/19/2024, 3:09 PM

## 2024-01-19 NOTE — Plan of Care (Signed)
" °  Problem: Education: Goal: Knowledge of General Education information will improve Description: Including pain rating scale, medication(s)/side effects and non-pharmacologic comfort measures Outcome: Progressing   Problem: Health Behavior/Discharge Planning: Goal: Ability to manage health-related needs will improve Outcome: Progressing   Problem: Health Behavior/Discharge Planning: Goal: Ability to manage health-related needs will improve Outcome: Progressing   Problem: Coping: Goal: Level of anxiety will decrease Outcome: Progressing   Problem: Nutrition: Goal: Adequate nutrition will be maintained Outcome: Progressing   Problem: Activity: Goal: Risk for activity intolerance will decrease Outcome: Progressing   "

## 2024-01-19 NOTE — Progress Notes (Signed)
 Occupational Therapy Treatment Patient Details Name: Suzanne [name] MRN: [id number] DOB: 12-07-1950 Today's Date: 01/19/2024   History of present illness Suzanne [name] is a 73 y.o. female with PMH of colon cancer s/p resection in 1989, anxiety, depression, fibromyalgia, IBS, osteoarthritis, HTN and neuropathy presented to ED after she sustained a fall at home.   OT comments  [name] was seen for OT treatment on this date. Upon arrival to room pt supine in bed, agreeable to OT Tx session. OT facilitated ADL management with education and assistance as described below. See ADL section for additional details regarding occupational performance. Pt continues to be functionally limited by generalized weakness, increased pain with mobility, and limited activity tolerance. VSS t/o session. Pt return verbalizes understanding of education provided. Pt is progressing toward OT goals and continues to benefit from skilled OT services to maximize return to PLOF and minimize risk of future falls, injury, and readmission. Will continue to follow POC as written. Discharge recommendation remains appropriate.        If plan is discharge home, recommend the following:  A lot of help with walking and/or transfers;A lot of help with bathing/dressing/bathroom;Assistance with cooking/housework;Direct supervision/assist for medications management;Assist for transportation   Equipment Recommendations  Other (comment)    Recommendations for Other Services      Precautions / Restrictions Precautions Precautions: Fall Recall of Precautions/Restrictions: Intact Restrictions Weight Bearing Restrictions Per Provider Order: Yes RLE Weight Bearing Per Provider Order: Weight bearing as tolerated       Mobility Bed Mobility Overal bed mobility: Needs Assistance Bed Mobility: Supine to Sit     Supine to sit: Supervision, HOB elevated, Used rails          Transfers Overall transfer level: Needs  assistance Equipment used: Rolling walker (2 wheels) Transfers: Sit to/from Stand, Bed to chair/wheelchair/BSC Sit to Stand: Contact guard assist, Supervision     Step pivot transfers: Contact guard assist, Supervision           Balance Overall balance assessment: Needs assistance Sitting-balance support: No upper extremity supported Sitting balance-Leahy Scale: Good     Standing balance support: During functional activity, Reliant on assistive device for balance Standing balance-Leahy Scale: Good                             ADL either performed or assessed with clinical judgement   ADL Overall ADL's : Needs assistance/impaired     Grooming: Sitting;Set up;Supervision/safety               Lower Body Dressing: Set up;Supervision/safety;Bed level Lower Body Dressing Details (indicate cue type and reason): Able to adjust bilat socks while semi-supine in bed. Anticipat MOD A for LB ADL management from STS. Toilet Transfer: BSC/3in1;Minimal assistance;Contact guard assist;Rolling walker (2 wheels) Toilet Transfer Details (indicate cue type and reason): Simulated to recliner. Pt declines use of BSC 2/2 lack of privacy in double occupancy room. Mobility remains too limited to transfer to room commode. Is able to take a few slow steps to turn to recliner with CGA for safety and heavy reliance on RW for support. Educated on transfer technique and safe use of RW.           General ADL Comments: SUPERVISION for bed mobility. Able to come to sitting at EOB with increased time/effort to perform and use of bed functions. Pt very motivated to go home over the holidays. Educated on STR vs. HH OT  services and problem solved strategies to support pt safety and functional independence at length if she were to elect to go home rather than STR.    Extremity/Trunk Assessment Upper Extremity Assessment Upper Extremity Assessment: Generalized weakness   Lower Extremity  Assessment Lower Extremity Assessment: Generalized weakness        Vision Patient Visual Report: No change from baseline     Perception     Praxis     Communication Communication Communication: No apparent difficulties   Cognition Arousal: Alert Behavior During Therapy: WFL for tasks assessed/performed Cognition: No apparent impairments                               Following commands: Intact        Cueing   Cueing Techniques: Verbal cues  Exercises Other Exercises Other Exercises: OT facilitated ADL management with education and assistance as described above.    Shoulder Instructions       General Comments      Pertinent Vitals/ Pain       Pain Assessment Pain Assessment: 0-10 Pain Score: 6  Pain Location: L LE Pain Descriptors / Indicators: Aching, Discomfort, Grimacing, Sore Pain Intervention(s): Limited activity within patient's tolerance, Monitored during session, Repositioned  Home Living                                          Prior Functioning/Environment              Frequency  Min 2X/week        Progress Toward Goals  OT Goals(current goals can now be found in the care plan section)  Progress towards OT goals: Progressing toward goals  Acute Rehab OT Goals Patient Stated Goal: to go home OT Goal Formulation: With patient Time For Goal Achievement: 01/31/24 Potential to Achieve Goals: Fair  Plan      Co-evaluation                 AM-PAC OT 6 Clicks Daily Activity     Outcome Measure   Help from another person eating meals?: None Help from another person taking care of personal grooming?: A Little Help from another person toileting, which includes using toliet, bedpan, or urinal?: A Lot Help from another person bathing (including washing, rinsing, drying)?: A Lot Help from another person to put on and taking off regular upper body clothing?: A Little Help from another person to put on  and taking off regular lower body clothing?: A Lot 6 Click Score: 16    End of Session Equipment Utilized During Treatment: Rolling walker (2 wheels)  OT Visit Diagnosis: Unsteadiness on feet (R26.81);Repeated falls (R29.6);Muscle weakness (generalized) (M62.81);History of falling (Z91.81);Pain Pain - Right/Left: Left Pain - part of body: Leg   Activity Tolerance Patient limited by pain   Patient Left in bed;with call bell/phone within reach;with bed alarm set   Nurse Communication Mobility status;Patient requests pain meds        Time: 9094-9048 OT Time Calculation (min): 46 min  Charges: OT General Charges $OT Visit: 1 Visit OT Treatments $Self Care/Home Management : 38-52 mins  Jhonny Pelton, M.S., OTR/L 01/19/2024, 11:44 AM

## 2024-01-19 NOTE — NC FL2 (Signed)
 " [place]  MEDICAID FL2 LEVEL OF CARE FORM     IDENTIFICATION  Patient Name: Suzanne [name] Birthdate: 1950-07-07 Sex: female Admission Date (Current Location): 01/17/2024  [place] and [place] Number:  Chiropodist and Address:  [facility], [address], [place], [place] [address]      Provider Number: [id number]  Attending Physician Name and Address:  Kathrin Mignon DASEN, MD  Relative Name and Phone Number:  Dorothyann Blush [phone]    Current Level of Care: Hospital Recommended Level of Care: Skilled Nursing Facility Prior Approval Number:    Date Approved/Denied:   PASRR Number: [id number]  Discharge Plan: SNF    Current Diagnoses: Patient Active Problem List   Diagnosis Date Noted   Vitamin D  deficiency 01/19/2024   Pelvic fracture (HCC) 01/17/2024    Orientation RESPIRATION BLADDER Height & Weight     Self, Time, Situation, Place  Normal Continent Weight: 78.7 kg Height:  5' 6 (167.6 cm)  BEHAVIORAL SYMPTOMS/MOOD NEUROLOGICAL BOWEL NUTRITION STATUS      Continent Diet (Regular)  AMBULATORY STATUS COMMUNICATION OF NEEDS Skin   Extensive Assist Verbally Normal                       Personal Care Assistance Level of Assistance  Bathing, Dressing Bathing Assistance: Maximum assistance   Dressing Assistance: Maximum assistance     Functional Limitations Info             SPECIAL CARE FACTORS FREQUENCY  PT (By licensed PT), OT (By licensed OT)     PT Frequency: 5x week OT Frequency: 5x week            Contractures Contractures Info: Not present    Additional Factors Info  Code Status, Allergies Code Status Info: Full Allergies Info: Latex, Contrast Media (iodinated Contrast Media)           Current Medications (01/19/2024):  This is the current hospital active medication list Current Facility-Administered Medications  Medication Dose Route Frequency Provider Last Rate Last Admin    acetaminophen  (TYLENOL ) tablet 975 mg  975 mg Oral Q6H WA Gonfa, Taye T, MD   975 mg at 01/19/24 1422   albuterol  (PROVENTIL ) (2.5 MG/3ML) 0.083% nebulizer solution 2.5 mg  2.5 mg Inhalation Q4H PRN Gonfa, Taye T, MD       busPIRone  (BUSPAR ) tablet 10 mg  10 mg Oral BID Gonfa, Taye T, MD   10 mg at 01/19/24 0820   calcium  carbonate (OS-CAL - dosed in mg of elemental calcium ) tablet 1,250 mg  1 tablet Oral BID WC Gonfa, Taye T, MD   1,250 mg at 01/19/24 9178   doxycycline  (VIBRAMYCIN ) 50 MG capsule 50 mg  50 mg Oral Daily Gonfa, Taye T, MD   50 mg at 01/19/24 9178   enoxaparin  (LOVENOX ) injection 40 mg  40 mg Subcutaneous Q24H Gonfa, Taye T, MD   40 mg at 01/18/24 2130   fluticasone  (FLONASE ) 50 MCG/ACT nasal spray 2 spray  2 spray Each Nare Daily Gonfa, Taye T, MD       gabapentin  (NEURONTIN ) capsule 800 mg  800 mg Oral TID Gonfa, Taye T, MD   800 mg at 01/19/24 1608   HYDROmorphone  (DILAUDID ) injection 0.5 mg  0.5 mg Intravenous Q3H PRN Gonfa, Taye T, MD   0.5 mg at 01/19/24 1613   loratadine  (CLARITIN ) tablet 10 mg  10 mg Oral Daily Gonfa, Taye T, MD   10 mg at  01/19/24 0821   magnesium  citrate solution 1 Bottle  1 Bottle Oral Daily PRN Gonfa, Taye T, MD       metFORMIN  (GLUCOPHAGE ) tablet 1,000 mg  1,000 mg Oral Q lunch Gonfa, Taye T, MD   1,000 mg at 01/19/24 1103   ondansetron  (ZOFRAN ) tablet 4 mg  4 mg Oral Q6H PRN Gonfa, Taye T, MD   4 mg at 01/18/24 2130   Or   ondansetron  (ZOFRAN ) injection 4 mg  4 mg Intravenous Q6H PRN Gonfa, Taye T, MD       oxyCODONE  (Oxy IR/ROXICODONE ) immediate release tablet 10 mg  10 mg Oral Q6H PRN Gonfa, Taye T, MD   10 mg at 01/18/24 2130   oxyCODONE  (Oxy IR/ROXICODONE ) immediate release tablet 5 mg  5 mg Oral Q6H PRN Gonfa, Taye T, MD       polyethylene glycol (MIRALAX  / GLYCOLAX ) packet 17 g  17 g Oral BID PRN Gonfa, Taye T, MD   17 g at 01/19/24 0820   rosuvastatin  (CRESTOR ) tablet 20 mg  20 mg Oral QHS Gonfa, Taye T, MD   20 mg at 01/18/24 2130    senna-docusate (Senokot-S) tablet 2 tablet  2 tablet Oral BID Gonfa, Taye T, MD   2 tablet at 01/19/24 0820   spironolactone  (ALDACTONE ) tablet 100 mg  100 mg Oral BID Gonfa, Taye T, MD   100 mg at 01/19/24 0820   venlafaxine  XR (EFFEXOR -XR) 24 hr capsule 150 mg  150 mg Oral BID Gonfa, Taye T, MD   150 mg at 01/19/24 9178   Vitamin D  (Ergocalciferol ) (DRISDOL ) 1.25 MG (50000 UNIT) capsule 50,000 Units  50,000 Units Oral Q7 days Gonfa, Taye T, MD   50,000 Units at 01/18/24 1338     Discharge Medications: Please see discharge summary for a list of discharge medications.  Relevant Imaging Results:  Relevant Lab Results:   Additional Information SS# [id number]  [name], RN     "

## 2024-01-19 NOTE — Progress Notes (Signed)
 " PROGRESS NOTE  Suzanne [name] FMW:[id number] DOB: [birth year]   PCP: Tobie Domino, MD  Patient is from: Home.  Lives with sisters.  Uses cane intermittently   DOA: 01/17/2024 LOS: 2  Chief complaints Chief Complaint  Patient presents with   Fall     Brief Narrative / Interim history: 73 y.o. female with PMH of colon cancer s/p resection in 1989, anxiety, depression, fibromyalgia, IBS, osteoarthritis, HTN and neuropathy presented to ED after she sustained a fall at home, and admitted with pelvic fracture. CT showed right superior and inferior rami fracture, right pubic body fracture and slightly displaced fracture of the inferior right hemi-sacrum.  Orthopedic surgery consulted and recommended nonoperative management.  Therapy recommended SNF.  Medically stable for discharge.  Subjective: Seen and examined earlier this morning.  No major events overnight or this morning.  No complaints.  Rates her pain 4/10 resting.  Pain is worse with movement and activity.  She is about to get up to work with therapy.  She is anxious about this.  No bowel movement yet.  Feels like she might go soon   Assessment and plan: Accidental fall at home-mechanical fall.  No prodromes CT head without acute finding. Multiple pelvic fracture-pelvic CT as above -Ortho recommended nonoperative management.  WBAT, therapy and pain control -Multimodal pain control with scheduled Tylenol  -Oxycodone  5 mg every 6 hours as needed for moderate pain -Oxycodone  10 mg every 6 hours as needed severe pain -IV Dilaudid  0.5 mg every 3 hours as needed for breakthrough pain -Continue home gabapentin . -Bowel regimen-Senokot twice daily, MiraLAX  and mag citrate as needed -Vitamin D  level 12.1.  Vitamin D  and calcium  supplementation -PT/OT-recommended SNF.   Essential hypertension: Normotensive but soft.  -Continue home Aldactone .  Confirmed dosage with patient.   Anxiety, depression, fibromyalgia, neuropathy: Stable -  Continue home.  Resume gabapentin .  Vitamin D  deficiency -Vitamin D  supplementation as above   History of colon cancer s/p resection in 1989-noted  Body mass index is 28 kg/m.           DVT prophylaxis:  enoxaparin  (LOVENOX ) injection 40 mg Start: 01/17/24 2200  Code Status: Full code Family Communication: None at bedside Level of care: Med-Surg Status is: Inpatient Remains inpatient appropriate because: Accidental fall at home and pelvic fracture   Final disposition: SNF.  Medically stable for discharge.   35 minutes with more than 50% spent in reviewing records, counseling patient/family and coordinating care.  Consultants:  Orthopedic surgery  Procedures: None  Microbiology summarized: None  Objective: Vitals:   01/18/24 0834 01/18/24 1518 01/18/24 1932 01/19/24 0715  BP: 124/72 126/75 113/79 123/72  Pulse: 82 87 91 88  Resp: 17 16 17 17   Temp: 98.1 F (36.7 C) 97.8 F (36.6 C) 98 F (36.7 C) 98.3 F (36.8 C)  TempSrc:    Oral  SpO2: 97% 97% 96% 96%  Weight:      Height:        Examination:  GENERAL: No apparent distress.  Nontoxic.  Sitting on bedside chair. HEENT: MMM.  Vision and hearing grossly intact.  NECK: Supple.  No apparent JVD.  RESP:  No IWOB.  Fair aeration bilaterally. CVS:  RRR. Heart sounds normal.  ABD/GI/GU: BS+. Abd soft, NTND.  MSK/EXT:   No apparent deformity. Moves extremities but limited by pain and right hip. No edema.  SKIN: no apparent skin lesion or wound NEURO: Awake and alert. Oriented appropriately.  No apparent focal neuro deficit. PSYCH: Calm. Normal affect.  Sch Meds:  Scheduled Meds:  acetaminophen   975 mg Oral Q6H WA   busPIRone   10 mg Oral BID   calcium  carbonate  1 tablet Oral BID WC   doxycycline   50 mg Oral Daily   enoxaparin  (LOVENOX ) injection  40 mg Subcutaneous Q24H   fluticasone   2 spray Each Nare Daily   gabapentin   800 mg Oral TID   loratadine   10 mg Oral Daily   metFORMIN   1,000 mg Oral Q  lunch   rosuvastatin   20 mg Oral QHS   senna-docusate  2 tablet Oral BID   spironolactone   100 mg Oral BID   venlafaxine  XR  150 mg Oral BID   Vitamin D  (Ergocalciferol )  50,000 Units Oral Q7 days   Continuous Infusions: PRN Meds:.albuterol , HYDROmorphone  (DILAUDID ) injection, magnesium  citrate, ondansetron  **OR** ondansetron  (ZOFRAN ) IV, oxyCODONE , oxyCODONE , polyethylene glycol  Antimicrobials: Anti-infectives (From admission, onward)    Start     Dose/Rate Route Frequency Ordered Stop   01/17/24 1300  doxycycline  (VIBRAMYCIN ) 50 MG capsule 50 mg        50 mg Oral Daily 01/17/24 1134          I have personally reviewed the following labs and images: CBC: Recent Labs  Lab 01/17/24 0216 01/18/24 0425  WBC 7.9 6.6  NEUTROABS 3.3  --   HGB 12.6 12.6  HCT 38.2 38.2  MCV 101.3* 101.1*  PLT 299 257   BMP &GFR Recent Labs  Lab 01/17/24 0216 01/18/24 0425  NA 138 141  K 4.4 4.1  CL 103 102  CO2 26 28  GLUCOSE 105* 141*  BUN 23 22  CREATININE 0.85 0.82  CALCIUM  9.4 9.2   Estimated Creatinine Clearance: 65.7 mL/min (by C-G formula based on SCr of 0.82 mg/dL). Liver & Pancreas: Recent Labs  Lab 01/17/24 0216  AST 26  ALT 19  ALKPHOS 35*  BILITOT 0.2  PROT 6.4*  ALBUMIN 4.3   No results for input(s): LIPASE, AMYLASE in the last 168 hours. No results for input(s): AMMONIA in the last 168 hours. Diabetic: No results for input(s): HGBA1C in the last 72 hours. No results for input(s): GLUCAP in the last 168 hours. Cardiac Enzymes: Recent Labs  Lab 01/17/24 0215  CKTOTAL 123   No results for input(s): PROBNP in the last 8760 hours. Coagulation Profile: No results for input(s): INR, PROTIME in the last 168 hours. Thyroid  Function Tests: No results for input(s): TSH, T4TOTAL, FREET4, T3FREE, THYROIDAB in the last 72 hours. Lipid Profile: No results for input(s): CHOL, HDL, LDLCALC, TRIG, CHOLHDL, LDLDIRECT in the last 72  hours. Anemia Panel: No results for input(s): VITAMINB12, FOLATE, FERRITIN, TIBC, IRON, RETICCTPCT in the last 72 hours. Urine analysis:    Component Value Date/Time   COLORURINE Straw 09/18/2011 1521   APPEARANCEUR Clear 09/18/2011 1521   LABSPEC 1.008 09/18/2011 1521   PHURINE 7.0 09/18/2011 1521   GLUCOSEU Negative 09/18/2011 1521   HGBUR 1+ 09/18/2011 1521   BILIRUBINUR Negative 09/18/2011 1521   KETONESUR Negative 09/18/2011 1521   PROTEINUR Negative 09/18/2011 1521   NITRITE Negative 09/18/2011 1521   LEUKOCYTESUR Negative 09/18/2011 1521   Sepsis Labs: Invalid input(s): PROCALCITONIN, LACTICIDVEN  Microbiology: No results found for this or any previous visit (from the past 240 hours).  Radiology Studies: No results found.    Devlin Brink T. Jasalyn Frysinger [facility]  If 7PM-7AM, please contact night-coverage [email or web address] 01/19/2024, 11:36 AM   "

## 2024-01-19 NOTE — TOC Initial Note (Signed)
 Transition of Care [facility]) - Initial/Assessment Note    Patient Details  Name: Suzanne [name] MRN: [id number] Date of Birth: 1950/10/27  [facility]) CM/SW Contact:    Suzanne JONETTA Hamilton, RN Phone Number: 01/19/2024, 4:33 PM  Clinical Narrative:                  Met with patient, introduced self and explained role. Patient verbalized prior to this hospital admission she is independent at home including driving. 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 Patient lives at home with her two sisters. Her sister Suzanne [name] can assist with transportation if needed.  Discussed with patient therapy recommendations for SNF-STR once medically ready for discharge, patient verbalized understanding and agreement with this. Patient chooses as follows: 1- Administrator, 2- Education Officer, Museum, 3- Armed Forces Operational Officer.  Discussed with patient if no bed offers received she would need to provide additional choices, patient verbalized understanding.   PASRR obtained- [id number]  FL2 sent for signature Referrals to patients choice sent in HUB Patient will require a prior authorization.   Expected Discharge Plan: Skilled Nursing Facility Barriers to Discharge: Continued Medical Work up   Patient Goals and CMS Choice     Choice offered to / list presented to : Patient      Expected Discharge Plan and Services   Discharge Planning Services: CM Consult Post Acute Care Choice: Skilled Nursing Facility Living arrangements for the past 2 months: Single Family Home                                      Prior Living Arrangements/Services Living arrangements for the past 2 months: Single Family Home Lives with:: Relatives Patient language and need for interpreter reviewed:: Yes Do you feel safe going back to the place where you live?: Yes      Need for Family Participation in Patient Care: No (Comment) Care giver support system in place?: Yes (comment)   Criminal Activity/Legal  Involvement Pertinent to Current Situation/Hospitalization: No - Comment as needed  Activities of Daily Living   ADL Screening (condition at time of admission) Independently performs ADLs?: No Does the patient have a NEW difficulty with bathing/dressing/toileting/self-feeding that is expected to last >3 days?: Yes (Initiates electronic notice to provider for possible OT consult) Does the patient have a NEW difficulty with getting in/out of bed, walking, or climbing stairs that is expected to last >3 days?: Yes (Initiates electronic notice to provider for possible PT consult) Does the patient have a NEW difficulty with communication that is expected to last >3 days?: No Is the patient deaf or have difficulty hearing?: No Does the patient have difficulty seeing, even when wearing glasses/contacts?: No  Permission Sought/Granted Permission sought to share information with : Facility Medical Sales Representative                Emotional Assessment Appearance:: Appears stated age, Well-Groomed Attitude/Demeanor/Rapport: Engaged Affect (typically observed): Appropriate Orientation: : Oriented to Self, Oriented to Place, Oriented to  Time, Oriented to Situation Alcohol / Substance Use: Not Applicable Psych Involvement: No (comment)  Admission diagnosis:  Pelvic fracture (HCC) [S32.9XXA] Injury of head, initial encounter [S09.90XA] Fall, initial encounter [W19.XXXA] Multiple closed fractures of pelvis without disruption of pelvic ring, initial encounter [facility]) [S32.82XA] Patient Active Problem List   Diagnosis Date Noted   Vitamin D  deficiency 01/19/2024   Pelvic fracture (HCC) 01/17/2024  PCP:  Suzanne Domino, MD Pharmacy:   [facility] - [place], [place] - [address] [place] [address] Phone: [phone] Fax: [phone]     Social Drivers of Health (SDOH) Social History: SDOH Screenings   Food Insecurity: No Food Insecurity  (01/17/2024)  Housing: Patient Declined (01/17/2024)  Transportation Needs: No Transportation Needs (01/17/2024)  Utilities: Patient Declined (01/18/2024)  Social Connections: Unknown (01/17/2024)  Tobacco Use: Low Risk (01/17/2024)   SDOH Interventions:     Readmission Risk Interventions     No data to display

## 2024-01-19 NOTE — Care Management Important Message (Signed)
 Important Message  Patient Details  Name: Suzanne [name] MRN: [id number] Date of Birth: 03/03/1950   Important Message Given:  Yes - Medicare IM     Daiton Cowles W, CMA 01/19/2024, 2:38 PM

## 2024-01-19 NOTE — Plan of Care (Signed)
   Problem: Education: Goal: Knowledge of General Education information will improve Description: Including pain rating scale, medication(s)/side effects and non-pharmacologic comfort measures Outcome: Progressing   Problem: Health Behavior/Discharge Planning: Goal: Ability to manage health-related needs will improve Outcome: Progressing   Problem: Clinical Measurements: Goal: Cardiovascular complication will be avoided Outcome: Progressing

## 2024-01-19 NOTE — Plan of Care (Signed)
   Problem: Clinical Measurements: Goal: Respiratory complications will improve Outcome: Progressing   Problem: Activity: Goal: Risk for activity intolerance will decrease Outcome: Progressing

## 2024-01-20 DIAGNOSIS — E559 Vitamin D deficiency, unspecified: Secondary | ICD-10-CM | POA: Diagnosis not present

## 2024-01-20 DIAGNOSIS — S32511A Fracture of superior rim of right pubis, initial encounter for closed fracture: Secondary | ICD-10-CM | POA: Diagnosis not present

## 2024-01-20 DIAGNOSIS — W19XXXA Unspecified fall, initial encounter: Secondary | ICD-10-CM | POA: Diagnosis not present

## 2024-01-20 MED ORDER — POLYETHYLENE GLYCOL 3350 17 G PO PACK
17.0000 g | PACK | Freq: Two times a day (BID) | ORAL | Status: DC
  Administered 2024-01-20: 17 g via ORAL
  Filled 2024-01-20: qty 1

## 2024-01-20 NOTE — Progress Notes (Signed)
 Physical Therapy Treatment Patient Details Name: Suzanne [name] MRN: [id number] DOB: 1950-07-18 Today's Date: 01/20/2024   History of Present Illness Suzanne [name] is a 73 y.o. female with PMH of colon cancer s/p resection in 1989, anxiety, depression, fibromyalgia, IBS, osteoarthritis, HTN and neuropathy presented to ED after she sustained a fall at home.    PT Comments  Pt was supine in bed upon arrival. She is A and O x 4 but extremely slow moving due to pain. Pt agrees to OOB activity. Required more assistance to safely exit bed today however was able to tolerate standing and ambulating with overall less assist. Pt is progressing towards all PT goals however will benefit from post acute PT to maximize independence and safety with all ADLs.  Pt remains far from baseline abilities and does not have available assistance at DC.    If plan is discharge home, recommend the following: A little help with walking and/or transfers;A little help with bathing/dressing/bathroom;Help with stairs or ramp for entrance;Assist for transportation     Equipment Recommendations  Rolling walker (2 wheels)       Precautions / Restrictions Precautions Precautions: Fall Recall of Precautions/Restrictions: Intact Restrictions Weight Bearing Restrictions Per Provider Order: Yes RLE Weight Bearing Per Provider Order: Weight bearing as tolerated     Mobility  Bed Mobility Overal bed mobility: Needs Assistance Bed Mobility: Supine to Sit  Supine to sit: Min assist, Mod assist  General bed mobility comments: increased assistance required to exit the bed today.    Transfers Overall transfer level: Needs assistance Equipment used: Rolling walker (2 wheels) Transfers: Sit to/from Stand Sit to Stand: Contact guard assist  General transfer comment: CGA for safety with vcs for handplacement and technique improvements. good eccentric control with stand>sit    Ambulation/Gait Ambulation/Gait assistance:  Contact guard assist Gait Distance (Feet): 15 Feet Assistive device: Rolling walker (2 wheels) Gait Pattern/deviations: Step-to pattern, Antalgic, Trunk flexed Gait velocity: decreased/extrememly slow  General Gait Details: Pt was able to tolerate ambulation ~ 15 ft with RW. slow antalgic step to gait sequencing however no LOB or safety concerns. ~ to walk ~ 15 ft    Balance Overall balance assessment: Needs assistance Sitting-balance support: No upper extremity supported Sitting balance-Leahy Scale: Good     Standing balance support: Bilateral upper extremity supported, During functional activity, Reliant on assistive device for balance Standing balance-Leahy Scale: Good      Communication Communication Communication: No apparent difficulties  Cognition Arousal: Alert Behavior During Therapy: WFL for tasks assessed/performed   PT - Cognitive impairments: No apparent impairments    PT - Cognition Comments: Pt is A and O x 4. Agreeable to session and cooperative throughout. slow moving due to pain but did put forth good effort Following commands: Intact      Cueing Cueing Techniques: Verbal cues         Pertinent Vitals/Pain Pain Assessment Pain Assessment: 0-10 Pain Score: 5  Pain Location: L LE with mobility Pain Descriptors / Indicators: Aching, Discomfort, Grimacing, Sore Pain Intervention(s): Limited activity within patient's tolerance, Monitored during session, Premedicated before session, Repositioned     PT Goals (current goals can now be found in the care plan section) Acute Rehab PT Goals Patient Stated Goal: stay here till I can go home Progress towards PT goals: Progressing toward goals    Frequency    Min 2X/week       AM-PAC PT 6 Clicks Mobility   Outcome Measure  Help needed  turning from your back to your side while in a flat bed without using bedrails?: A Little Help needed moving from lying on your back to sitting on the side of a  flat bed without using bedrails?: A Little Help needed moving to and from a bed to a chair (including a wheelchair)?: A Little Help needed standing up from a chair using your arms (e.g., wheelchair or bedside chair)?: A Little Help needed to walk in hospital room?: A Lot Help needed climbing 3-5 steps with a railing? : A Lot 6 Click Score: 16    End of Session   Activity Tolerance: Patient tolerated treatment well Patient left: in chair;with call bell/phone within reach Nurse Communication: Mobility status PT Visit Diagnosis: Other abnormalities of gait and mobility (R26.89);Muscle weakness (generalized) (M62.81);Difficulty in walking, not elsewhere classified (R26.2);Unsteadiness on feet (R26.81)     Time: 8543-8481 PT Time Calculation (min) (ACUTE ONLY): 22 min  Charges:    $Therapeutic Activity: 8-22 mins PT General Charges $$ ACUTE PT VISIT: 1 Visit                    Rankin Essex PTA 01/20/2024, 3:54 PM

## 2024-01-20 NOTE — Plan of Care (Signed)

## 2024-01-20 NOTE — Progress Notes (Signed)
 " PROGRESS NOTE  Suzanne [name] FMW:[id number] DOB: 1950/12/15   PCP: Tobie Domino, MD  Patient is from: Home.  Lives with sisters.  Uses cane intermittently   DOA: 01/17/2024 LOS: 3  Chief complaints Chief Complaint  Patient presents with   Fall     Brief Narrative / Interim history: 73 y.o. female with PMH of colon cancer s/p resection in 1989, anxiety, depression, fibromyalgia, IBS, osteoarthritis, HTN and neuropathy presented to ED after she sustained a fall at home, and admitted with pelvic fracture. CT showed right superior and inferior rami fracture, right pubic body fracture and slightly displaced fracture of the inferior right hemi-sacrum.  Orthopedic surgery consulted and recommended nonoperative management.  Therapy recommended SNF.  Medically stable for discharge.  12/23: Patient had a bed offer-pending authorization.  Subjective: Patient was seen and examined today.  Continue to have pain with ambulation.  No BM yet, per patient she normally takes bowel prep, 1 cup daily at home as she is prone to constipation. Okay Assessment and plan: Accidental fall at home-mechanical fall.  No prodromes CT head without acute finding. Multiple pelvic fracture-pelvic CT as above -Ortho recommended nonoperative management.  WBAT, therapy and pain control -Multimodal pain control with scheduled Tylenol  -Oxycodone  5 mg every 6 hours as needed for moderate pain -Oxycodone  10 mg every 6 hours as needed severe pain -IV Dilaudid  0.5 mg every 3 hours as needed for breakthrough pain -Continue home gabapentin . -Bowel regimen-Senokot twice daily, MiraLAX  and mag citrate as needed -Vitamin D  level 12.1.  Vitamin D  and calcium  supplementation -PT/OT-recommended SNF.   Essential hypertension: Blood pressure mildly elevated -Continue home Aldactone .     Anxiety, depression, fibromyalgia, neuropathy: Stable - Continue home gabapentin .  Vitamin D  deficiency -Vitamin D  supplementation as  above   History of colon cancer s/p resection in 1989-noted  Body mass index is 28 kg/m.  DVT prophylaxis:  enoxaparin  (LOVENOX ) injection 40 mg Start: 01/17/24 2200  Code Status: Full code Family Communication: Discussed with patient Level of care: Med-Surg Status is: Inpatient Remains inpatient appropriate because: Accidental fall at home and pelvic fracture   Final disposition: SNF.  Medically stable for discharge.   44 minutes with more than 50% spent in reviewing records, counseling patient/family and coordinating care.  Consultants:  Orthopedic surgery  Procedures: None  Microbiology summarized: None  Objective: Vitals:   01/19/24 1417 01/19/24 2011 01/20/24 0416 01/20/24 0749  BP: (!) 142/71 (!) 123/57 107/60 (!) 141/64  Pulse: (!) 107 93 82 82  Resp: 16 18 19 18   Temp: 98.6 F (37 C) 98.2 F (36.8 C) 97.8 F (36.6 C) 98 F (36.7 C)  TempSrc:  Oral Oral   SpO2: 97% 97% 95% 96%  Weight:      Height:        Examination:  General.  Well-developed elderly lady, in no acute distress. Pulmonary.  Lungs clear bilaterally, normal respiratory effort. CV.  Regular rate and rhythm, no JVD, rub or murmur. Abdomen.  Soft, nontender, nondistended, BS positive. CNS.  Alert and oriented .  No focal neurologic deficit. Extremities.  No edema,  pulses intact and symmetrical. Psychiatry.  Judgment and insight appears normal.   Sch Meds:  Scheduled Meds:  acetaminophen   975 mg Oral Q6H WA   busPIRone   10 mg Oral BID   calcium  carbonate  1 tablet Oral BID WC   doxycycline   50 mg Oral Daily   enoxaparin  (LOVENOX ) injection  40 mg Subcutaneous Q24H   fluticasone   2 spray Each Nare Daily   gabapentin   800 mg Oral TID   loratadine   10 mg Oral Daily   metFORMIN   1,000 mg Oral Q lunch   polyethylene glycol  17 g Oral BID   rosuvastatin   20 mg Oral QHS   senna-docusate  2 tablet Oral BID   spironolactone   100 mg Oral BID   venlafaxine  XR  150 mg Oral BID   Vitamin  D (Ergocalciferol )  50,000 Units Oral Q7 days   Continuous Infusions: PRN Meds:.albuterol , HYDROmorphone  (DILAUDID ) injection, magnesium  citrate, ondansetron  **OR** ondansetron  (ZOFRAN ) IV, oxyCODONE , oxyCODONE   Antimicrobials: Anti-infectives (From admission, onward)    Start     Dose/Rate Route Frequency Ordered Stop   01/17/24 1300  doxycycline  (VIBRAMYCIN ) 50 MG capsule 50 mg        50 mg Oral Daily 01/17/24 1134         I have personally reviewed the following labs and images: CBC: Recent Labs  Lab 01/17/24 0216 01/18/24 0425  WBC 7.9 6.6  NEUTROABS 3.3  --   HGB 12.6 12.6  HCT 38.2 38.2  MCV 101.3* 101.1*  PLT 299 257   BMP &GFR Recent Labs  Lab 01/17/24 0216 01/18/24 0425  NA 138 141  K 4.4 4.1  CL 103 102  CO2 26 28  GLUCOSE 105* 141*  BUN 23 22  CREATININE 0.85 0.82  CALCIUM  9.4 9.2   Estimated Creatinine Clearance: 65.7 mL/min (by C-G formula based on SCr of 0.82 mg/dL). Liver & Pancreas: Recent Labs  Lab 01/17/24 0216  AST 26  ALT 19  ALKPHOS 35*  BILITOT 0.2  PROT 6.4*  ALBUMIN 4.3   No results for input(s): LIPASE, AMYLASE in the last 168 hours. No results for input(s): AMMONIA in the last 168 hours. Diabetic: No results for input(s): HGBA1C in the last 72 hours. No results for input(s): GLUCAP in the last 168 hours. Cardiac Enzymes: Recent Labs  Lab 01/17/24 0215  CKTOTAL 123   No results for input(s): PROBNP in the last 8760 hours. Coagulation Profile: No results for input(s): INR, PROTIME in the last 168 hours. Thyroid  Function Tests: No results for input(s): TSH, T4TOTAL, FREET4, T3FREE, THYROIDAB in the last 72 hours. Lipid Profile: No results for input(s): CHOL, HDL, LDLCALC, TRIG, CHOLHDL, LDLDIRECT in the last 72 hours. Anemia Panel: No results for input(s): VITAMINB12, FOLATE, FERRITIN, TIBC, IRON, RETICCTPCT in the last 72 hours. Urine analysis:    Component Value  Date/Time   COLORURINE Straw 09/18/2011 1521   APPEARANCEUR Clear 09/18/2011 1521   LABSPEC 1.008 09/18/2011 1521   PHURINE 7.0 09/18/2011 1521   GLUCOSEU Negative 09/18/2011 1521   HGBUR 1+ 09/18/2011 1521   BILIRUBINUR Negative 09/18/2011 1521   KETONESUR Negative 09/18/2011 1521   PROTEINUR Negative 09/18/2011 1521   NITRITE Negative 09/18/2011 1521   LEUKOCYTESUR Negative 09/18/2011 1521   Sepsis Labs: Invalid input(s): PROCALCITONIN, LACTICIDVEN  Microbiology: No results found for this or any previous visit (from the past 240 hours).  Radiology Studies: No results found.  Amaryllis Dare, MD [facility]  This record has been created using Conservation officer, historic buildings. Errors have been sought and corrected,but may not always be located. Such creation errors do not reflect on the standard of care.   If 7PM-7AM, please contact night-coverage [email or web address] 01/20/2024, 3:58 PM   "

## 2024-01-20 NOTE — Plan of Care (Signed)
  Problem: Education: Goal: Knowledge of General Education information will improve Description: Including pain rating scale, medication(s)/side effects and non-pharmacologic comfort measures Outcome: Progressing   Problem: Health Behavior/Discharge Planning: Goal: Ability to manage health-related needs will improve Outcome: Progressing   Problem: Clinical Measurements: Goal: Ability to maintain clinical measurements within normal limits will improve Outcome: Progressing   Problem: Clinical Measurements: Goal: Will remain free from infection Outcome: Progressing   Problem: Clinical Measurements: Goal: Diagnostic test results will improve Outcome: Progressing   Problem: Clinical Measurements: Goal: Cardiovascular complication will be avoided Outcome: Progressing   Problem: Activity: Goal: Risk for activity intolerance will decrease Outcome: Progressing   Problem: Nutrition: Goal: Adequate nutrition will be maintained Outcome: Progressing

## 2024-01-20 NOTE — TOC Progression Note (Addendum)
 Transition of Care [facility]) - Progression Note    Patient Details  Name: Suzanne [name] MRN: [id number] Date of Birth: [birth year]  Transition of Care [facility]) CM/SW Contact  Alvaro Louder, [place] Phone Number: 01/20/2024, 3:35 PM  Clinical Narrative:   4:26 PM: Auth Back for patient to admit to [facility] tomorrow pending medical readiness. Approved JluyPI:[id number] Dates:12/23-12/25/2025 Next Review Date: 01/22/2024    Patient has selected SNF [facility]. Auth started for patient to admit. When auth is back.   Pending AuthID: [id number]   TOC to follow for discharge    Expected Discharge Plan: Skilled Nursing Facility Barriers to Discharge: Continued Medical Work up               Expected Discharge Plan and Services   Discharge Planning Services: CM Consult Post Acute Care Choice: Skilled Nursing Facility Living arrangements for the past 2 months: Single Family Home                                       Social Drivers of Health (SDOH) Interventions SDOH Screenings   Food Insecurity: No Food Insecurity (01/17/2024)  Housing: Patient Declined (01/17/2024)  Transportation Needs: No Transportation Needs (01/17/2024)  Utilities: Patient Declined (01/18/2024)  Social Connections: Unknown (01/17/2024)  Tobacco Use: Low Risk (01/17/2024)    Readmission Risk Interventions     No data to display

## 2024-01-21 DIAGNOSIS — S0990XA Unspecified injury of head, initial encounter: Secondary | ICD-10-CM

## 2024-01-21 DIAGNOSIS — E559 Vitamin D deficiency, unspecified: Secondary | ICD-10-CM | POA: Diagnosis not present

## 2024-01-21 DIAGNOSIS — S32511A Fracture of superior rim of right pubis, initial encounter for closed fracture: Secondary | ICD-10-CM | POA: Diagnosis not present

## 2024-01-21 DIAGNOSIS — W19XXXA Unspecified fall, initial encounter: Secondary | ICD-10-CM | POA: Diagnosis not present

## 2024-01-21 MED ORDER — POLYETHYLENE GLYCOL 3350 17 G PO PACK: 17.0000 g | PACK | Freq: Two times a day (BID) | ORAL | Status: AC

## 2024-01-21 MED ORDER — SENNOSIDES-DOCUSATE SODIUM 8.6-50 MG PO TABS: 2.0000 | ORAL_TABLET | Freq: Two times a day (BID) | ORAL | Status: AC

## 2024-01-21 MED ORDER — OXYCODONE HCL 5 MG PO TABS: 5.0000 mg | ORAL_TABLET | Freq: Four times a day (QID) | ORAL | 0 refills | Status: AC | PRN

## 2024-01-21 MED ORDER — MAGNESIUM CITRATE PO SOLN: 1.0000 | Freq: Every day | ORAL | Status: DC | PRN

## 2024-01-21 MED ORDER — ONDANSETRON HCL 4 MG PO TABS: 4.0000 mg | ORAL_TABLET | Freq: Four times a day (QID) | ORAL | Status: AC | PRN

## 2024-01-21 MED ORDER — VITAMIN D (ERGOCALCIFEROL) 1.25 MG (50000 UNIT) PO CAPS: 50000.0000 [IU] | ORAL_CAPSULE | ORAL | Status: AC

## 2024-01-21 MED ORDER — CALCIUM CARBONATE 1250 (500 CA) MG PO TABS: 1.0000 | ORAL_TABLET | Freq: Two times a day (BID) | ORAL | Status: AC

## 2024-01-21 NOTE — TOC Transition Note (Signed)
 Transition of Care [facility]) - Discharge Note   Patient Details  Name: Suzanne [name] MRN: [id number] Date of Birth: 1950-11-28  Transition of Care [facility]) CM/SW Contact:  Alvaro Louder, LCSW Phone Number: 01/21/2024, 1:22 PM   Clinical Narrative:  LCSWA received insurance approval for patient to admit to SNF. LCSWA confirmed with MD that patient is stable for discharge. LCSWA notified the patient and they are in agreement with discharge . LCSWA confirmed bed is available at [facility] Transport arranged with [facility] for next available.    RM [phone], Number to call report [phone]   TOC signing off  Final next level of care: Skilled Nursing Facility Barriers to Discharge: No Barriers Identified   Patient Goals and CMS Choice     Choice offered to / list presented to : Patient      Discharge Placement              Patient chooses bed at: [facility] Patient to be transferred to facility by: [facility] Name of family member notified: Self Patient and family notified of of transfer: 01/21/24  Discharge Plan and Services Additional resources added to the After Visit Summary for     Discharge Planning Services: CM Consult Post Acute Care Choice: Skilled Nursing Facility                               Social Drivers of Health (SDOH) Interventions SDOH Screenings   Food Insecurity: No Food Insecurity (01/17/2024)  Housing: Patient Declined (01/17/2024)  Transportation Needs: No Transportation Needs (01/17/2024)  Utilities: Patient Declined (01/18/2024)  Social Connections: Unknown (01/17/2024)  Tobacco Use: Low Risk (01/17/2024)     Readmission Risk Interventions     No data to display

## 2024-01-21 NOTE — Progress Notes (Signed)
 The clinical research associate called [facility]. There was no answer. Will attempt to call later.

## 2024-01-21 NOTE — Discharge Summary (Signed)
 " Physician Discharge Summary   Patient: Suzanne [name] MRN: [id number] DOB: 21-Oct-1950  Admit date:     01/17/2024  Discharge date: 01/21/2024  Discharge Physician: Amaryllis Dare   PCP: Tobie Domino, MD   Recommendations at discharge:  Please obtain CBC and BMP and follow-up Please avoid constipation while taking pain medications. Follow-up with orthopedic surgery Follow-up with primary care provider  Discharge Diagnoses: Principal Problem:   Pelvic fracture [facility]) Active Problems:   Vitamin D  deficiency   Head injury   [facility] Course: 72 y.o. female with PMH of colon cancer s/p resection in 1989, anxiety, depression, fibromyalgia, IBS, osteoarthritis, HTN and neuropathy presented to ED after she sustained a fall at home, and admitted with pelvic fracture. CT showed right superior and inferior rami fracture, right pubic body fracture and slightly displaced fracture of the inferior right hemi-sacrum.  Orthopedic surgery consulted and recommended nonoperative management with pain control and physical therapy. PT evaluated her and recommending going to SNF for rehab where she is being discharged for further management.  Patient need to continue with bowel regimen, at home she was taking colon prep 1 cup daily which was not listed in her MAR.  She is currently being discharged on MiraLAX , milk of magnesia and stool softener and bowel regimen can be titrated as needed.  Patient is otherwise at baseline, will continue with the rest of her home medications and follow-up with her providers for further assistance.   Pain control - [place]  Controlled Substance Reporting System database was reviewed. and patient was instructed, not to drive, operate heavy machinery, perform activities at heights, swimming or participation in water activities or provide baby-sitting services while on Pain, Sleep and Anxiety Medications; until their outpatient Physician has advised to do so again.  Also recommended to not to take more than prescribed Pain, Sleep and Anxiety Medications.  Consultants: Orthopedic surgery Procedures performed: None Disposition: Skilled nursing facility Diet recommendation:  Regular diet DISCHARGE MEDICATION: Allergies as of 01/21/2024       Reactions   Contrast Media [iodinated Contrast Media] Swelling   Latex Rash        Medication List     STOP taking these medications    pravastatin 80 MG tablet Commonly known as: PRAVACHOL   triamterene-hydrochlorothiazide 37.5-25 MG tablet Commonly known as: MAXZIDE-25       TAKE these medications    acetaminophen  500 MG tablet Commonly known as: TYLENOL  Take 1,500 mg by mouth 2 (two) times daily as needed for mild pain.   albuterol  108 (90 Base) MCG/ACT inhaler Commonly known as: VENTOLIN  HFA Inhale 2 puffs into the lungs every 4 (four) hours as needed.   busPIRone  10 MG tablet Commonly known as: BUSPAR  Take 10 mg by mouth 2 (two) times daily.   calcium  carbonate 1250 (500 Ca) MG tablet Commonly known as: OS-CAL - dosed in mg of elemental calcium  Take 1 tablet (1,250 mg total) by mouth 2 (two) times daily with a meal.   cetirizine 10 MG tablet Commonly known as: ZYRTEC Take 10 mg by mouth at bedtime.   doxycycline  50 MG capsule Commonly known as: VIBRAMYCIN  Take 50 mg by mouth daily.   fenofibrate 145 MG tablet Commonly known as: TRICOR Take 145 mg by mouth daily.   fluticasone  50 MCG/ACT nasal spray Commonly known as: FLONASE  Place 2 sprays into the nose daily as needed for allergies.   gabapentin  800 MG tablet Commonly known as: NEURONTIN  Take 800 mg by mouth  3 (three) times daily.   magnesium  citrate Soln Take 296 mLs (1 Bottle total) by mouth daily as needed for severe constipation.   meclizine 25 MG tablet Commonly known as: ANTIVERT Take 25 mg by mouth 3 (three) times daily as needed for dizziness.   metFORMIN  1000 MG tablet Commonly known as: GLUCOPHAGE  Take  1,000 mg by mouth daily. What changed: Another medication with the same name was removed. Continue taking this medication, and follow the directions you see here.   ondansetron  4 MG tablet Commonly known as: ZOFRAN  Take 1 tablet (4 mg total) by mouth every 6 (six) hours as needed for nausea.   oxyCODONE  5 MG immediate release tablet Commonly known as: Oxy IR/ROXICODONE  Take 1 tablet (5 mg total) by mouth every 6 (six) hours as needed for moderate pain (pain score 4-6) or severe pain (pain score 7-10).   polyethylene glycol 17 g packet Commonly known as: MIRALAX  / GLYCOLAX  Take 17 g by mouth 2 (two) times daily.   rosuvastatin  20 MG tablet Commonly known as: CRESTOR  Take 20 mg by mouth at bedtime.   senna-docusate 8.6-50 MG tablet Commonly known as: Senokot-S Take 2 tablets by mouth 2 (two) times daily.   spironolactone  50 MG tablet Commonly known as: ALDACTONE  Take 100 mg by mouth 2 (two) times daily.   venlafaxine  XR 150 MG 24 hr capsule Commonly known as: EFFEXOR -XR Take 150 mg by mouth 2 (two) times daily.   Vitamin D  (Ergocalciferol ) 1.25 MG (50000 UNIT) Caps capsule Commonly known as: DRISDOL  Take 1 capsule (50,000 Units total) by mouth every 7 (seven) days. Start taking on: January 25, 2024        Contact information for follow-up providers     Cleotilde Barrio, MD Follow up in 2 week(s).   Specialty: Orthopedic Surgery Why: For re-evaluation  Please make appt. my office prior to the patient leaving the hospital. Contact information: [address] [place] [address] [phone]         Tobie Domino, MD. Schedule an appointment as soon as possible for a visit in 1 week(s).   Specialty: Family Medicine Contact information: [address] [place] [address] [phone]              Contact information for after-discharge care     Destination     [facility] .    Service: Skilled Nursing Contact information: [address] [place]  [address] [phone]                    Discharge Exam: Fredricka Weights   01/17/24 1100 01/17/24 2124  Weight: 76.1 kg 78.7 kg   General.  Well-developed elderly lady, in no acute distress. Pulmonary.  Lungs clear bilaterally, normal respiratory effort. CV.  Regular rate and rhythm, no JVD, rub or murmur. Abdomen.  Soft, nontender, nondistended, BS positive. CNS.  Alert and oriented .  No focal neurologic deficit. Extremities.  No edema, no cyanosis, pulses intact and symmetrical. Psychiatry.  Judgment and insight appears normal.   Condition at discharge: stable  The results of significant diagnostics from this hospitalization (including imaging, microbiology, ancillary and laboratory) are listed below for reference.   Imaging Studies: CT Head Wo Contrast Result Date: 01/17/2024 EXAM: CT HEAD WITHOUT CONTRAST 01/17/2024 07:32:18 AM TECHNIQUE: CT of the head was performed without the administration of intravenous contrast. Automated exposure control, iterative reconstruction, and/or weight based adjustment of the mA/kV  was utilized to reduce the radiation dose to as low as reasonably achievable. COMPARISON: None available. CLINICAL HISTORY: Head trauma, minor (Age >= 65y) FINDINGS: BRAIN AND VENTRICLES: No acute hemorrhage. No evidence of acute infarct. No hydrocephalus. No extra-axial collection. No mass effect or midline shift. ORBITS: No acute abnormality. SINUSES: No acute abnormality. SOFT TISSUES AND SKULL: No acute soft tissue abnormality. No skull fracture. IMPRESSION: 1. No acute intracranial abnormality. Electronically signed by: Gilmore Molt 01/17/2024 07:38 AM EST RP Workstation: [id number]   CT PELVIS WO CONTRAST Result Date: 01/17/2024 EXAM: CT Pelvis, Without IV Contrast 01/17/2024 04:57:49 AM TECHNIQUE: Axial images were acquired through the pelvis without IV  contrast. Reformatted images were reviewed. Automated exposure control, iterative reconstruction, and/or weight based adjustment of the mA/kV was utilized to reduce the radiation dose to as low as reasonably achievable. COMPARISON: None available. CLINICAL HISTORY: Hip trauma, fracture suspected, xray done. FINDINGS: BONES: Horizontally oriented fracture of the medial aspect of the right superior pubic ramus, best seen on coronal reformations images 53 through 56. Nondisplaced fracture of the ventral cortex of the inferior pubic ramus, present on axial image 42 of series 3. Fracture of the pubic body demonstrated on the coronal and axial images. Slightly displaced fracture of the inferior aspect of the right hemisacrum, noted on image 19 of series 3. JOINTS: The right hip joint is intact. No dislocation. The joint spaces are normal. SOFT TISSUES: The soft tissues are unremarkable. INTRAPELVIC CONTENTS: Status post partial lower anterior resection, hysterectomy, and bilateral salpingo-oophorectomy. IMPRESSION: 1. Horizontally oriented fracture of the medial aspect of the right superior pubic ramus. 2. Nondisplaced fracture of the ventral cortex of the right inferior pubic ramus. 3. Fracture of the right pubic body. 4. Slightly displaced fracture of the inferior right hemisacrum. Electronically signed by: Evalene Coho MD 01/17/2024 05:31 AM EST RP Workstation: [id number]   DG Hip Unilat W or Wo Pelvis 2-3 Views Right Result Date: 01/17/2024 EXAM: 2 OR MORE VIEW(S) XRAY OF THE PELVIS AND RIGHT HIP 01/17/2024 02:45:44 AM COMPARISON: None available. CLINICAL HISTORY: Fall FINDINGS: BONES AND JOINTS: SI joints are symmetric. Question nondisplaced right pubic body fracture versus projection artifact. Consider CT for further evaluation. Bilateral hips demonstrate normal alignment. SOFT TISSUES: Surgical clips and enteric sutures in pelvis noted. IMPRESSION: 1. Question nondisplaced right pubic body fracture versus  projection artifact; consider CT for further evaluation. Electronically signed by: Norman Gatlin MD 01/17/2024 02:50 AM EST RP Workstation: [id number]   MM 3D SCREENING MAMMOGRAM BILATERAL BREAST Result Date: 01/14/2024 CLINICAL DATA:  Screening. EXAM: DIGITAL SCREENING BILATERAL MAMMOGRAM WITH TOMOSYNTHESIS AND CAD TECHNIQUE: Bilateral screening digital craniocaudal and mediolateral oblique mammograms were obtained. Bilateral screening digital breast tomosynthesis was performed. The images were evaluated with computer-aided detection. COMPARISON:  Previous exam(s). ACR Breast Density Category a: The breasts are almost entirely fatty. FINDINGS: There are no findings suspicious for malignancy. IMPRESSION: No mammographic evidence of malignancy. A result letter of this screening mammogram will be mailed directly to the patient. RECOMMENDATION: Screening mammogram in one year. (Code:SM-B-01Y) BI-RADS CATEGORY  1: Negative. Electronically Signed   By: Curtistine Noble   On: 01/14/2024 14:18    Microbiology: Results for orders placed or performed during the hospital encounter of 07/17/12  Surgical pcr screen     Status: None   Collection Time: 07/17/12  3:04 PM   Specimen: Nasal Mucosa; Nasal Swab  Result Value Ref Range Status   MRSA, PCR NEGATIVE NEGATIVE Final   Staphylococcus aureus NEGATIVE NEGATIVE Final  Comment:        The Xpert SA Assay (FDA approved for NASAL specimens in patients over 105 years of age), is one component of a comprehensive surveillance program.  Test performance has been validated by [employer] for patients greater than or equal to 74 year old. It is not intended to diagnose infection nor to guide or monitor treatment.    Labs: CBC: Recent Labs  Lab 01/17/24 0216 01/18/24 0425  WBC 7.9 6.6  NEUTROABS 3.3  --   HGB 12.6 12.6  HCT 38.2 38.2  MCV 101.3* 101.1*  PLT 299 257   Basic Metabolic Panel: Recent Labs  Lab 01/17/24 0216 01/18/24 0425  NA  138 141  K 4.4 4.1  CL 103 102  CO2 26 28  GLUCOSE 105* 141*  BUN 23 22  CREATININE 0.85 0.82  CALCIUM  9.4 9.2   Liver Function Tests: Recent Labs  Lab 01/17/24 0216  AST 26  ALT 19  ALKPHOS 35*  BILITOT 0.2  PROT 6.4*  ALBUMIN 4.3   CBG: No results for input(s): GLUCAP in the last 168 hours.  Discharge time spent: greater than 30 minutes.  This record has been created using Conservation officer, historic buildings. Errors have been sought and corrected,but may not always be located. Such creation errors do not reflect on the standard of care.   Signed: Amaryllis Dare, MD [facility] 01/21/2024 "

## 2024-01-21 NOTE — Progress Notes (Signed)
 Syeda, LPN called from [facility] and report was rendered to her. Pt is showing no s/s of pain or distress at this time. Will continue to monitor until pick up from EMS.

## 2024-02-12 ENCOUNTER — Encounter: Payer: Self-pay | Admitting: Ophthalmology

## 2024-02-16 NOTE — Discharge Instructions (Signed)

## 2024-02-18 ENCOUNTER — Ambulatory Visit: Payer: Self-pay

## 2024-02-18 ENCOUNTER — Ambulatory Visit
Admission: RE | Admit: 2024-02-18 | Discharge: 2024-02-18 | Disposition: A | Discharge status: Home / Self Care | Attending: Ophthalmology | Admitting: Ophthalmology

## 2024-02-18 ENCOUNTER — Encounter: Admission: RE | Disposition: A | Payer: Self-pay | Source: Home / Self Care | Attending: Ophthalmology

## 2024-02-18 ENCOUNTER — Other Ambulatory Visit: Payer: Self-pay

## 2024-02-18 ENCOUNTER — Encounter: Payer: Self-pay | Admitting: Ophthalmology

## 2024-02-18 DIAGNOSIS — H2511 Age-related nuclear cataract, right eye: Secondary | ICD-10-CM | POA: Diagnosis present

## 2024-02-18 DIAGNOSIS — I10 Essential (primary) hypertension: Secondary | ICD-10-CM | POA: Insufficient documentation

## 2024-02-18 HISTORY — PX: CATARACT EXTRACTION W/PHACO: SHX586

## 2024-02-18 HISTORY — DX: Prediabetes: R73.03

## 2024-02-18 LAB — GLUCOSE, CAPILLARY: Glucose-Capillary: 105 mg/dL — ABNORMAL HIGH (ref 70–99)

## 2024-02-18 MED ORDER — LACTATED RINGERS IV SOLN: INTRAVENOUS | Status: DC

## 2024-02-18 MED ORDER — PHENYLEPHRINE HCL 10 % OP SOLN
OPHTHALMIC | Status: AC
  Filled 2024-02-18: qty 5

## 2024-02-18 MED ORDER — TETRACAINE HCL 0.5 % OP SOLN
1.0000 [drp] | OPHTHALMIC | Status: DC | PRN
  Administered 2024-02-18 (×3): 1 [drp] via OPHTHALMIC

## 2024-02-18 MED ORDER — SIGHTPATH DOSE#1 BSS IO SOLN
INTRAOCULAR | Status: DC | PRN
  Administered 2024-02-18: 59 mL via OPHTHALMIC

## 2024-02-18 MED ORDER — MIDAZOLAM HCL (PF) 2 MG/2ML IJ SOLN
INTRAMUSCULAR | Status: DC | PRN
  Administered 2024-02-18: 1.5 mg via INTRAVENOUS

## 2024-02-18 MED ORDER — CYCLOPENTOLATE HCL 2 % OP SOLN
1.0000 [drp] | OPHTHALMIC | Status: AC | PRN
  Administered 2024-02-18 (×3): 1 [drp] via OPHTHALMIC

## 2024-02-18 MED ORDER — BRIMONIDINE TARTRATE-TIMOLOL 0.2-0.5 % OP SOLN
OPHTHALMIC | Status: DC | PRN
  Administered 2024-02-18: 1 [drp] via OPHTHALMIC

## 2024-02-18 MED ORDER — ACETAMINOPHEN 500 MG PO TABS
ORAL_TABLET | ORAL | Status: AC
  Filled 2024-02-18: qty 2

## 2024-02-18 MED ORDER — LIDOCAINE HCL (PF) 2 % IJ SOLN
INTRAOCULAR | Status: DC | PRN
  Administered 2024-02-18: 2 mL

## 2024-02-18 MED ORDER — ACETAMINOPHEN 500 MG PO TABS
1000.0000 mg | ORAL_TABLET | Freq: Once | ORAL | Status: AC
  Administered 2024-02-18: 1000 mg via ORAL

## 2024-02-18 MED ORDER — SIGHTPATH DOSE#1 NA HYALUR & NA CHOND-NA HYALUR IO KIT
PACK | INTRAOCULAR | Status: DC | PRN
  Administered 2024-02-18: 1 via OPHTHALMIC

## 2024-02-18 MED ORDER — SIGHTPATH DOSE#1 BSS IO SOLN
INTRAOCULAR | Status: DC | PRN
  Administered 2024-02-18: 15 mL via INTRAOCULAR

## 2024-02-18 MED ORDER — CEFUROXIME OPHTHALMIC INJECTION 1 MG/0.1 ML
INJECTION | OPHTHALMIC | Status: DC | PRN
  Administered 2024-02-18: 1 mg via INTRACAMERAL

## 2024-02-18 MED ORDER — CYCLOPENTOLATE HCL 2 % OP SOLN
OPHTHALMIC | Status: AC
  Filled 2024-02-18: qty 2

## 2024-02-18 MED ORDER — MIDAZOLAM HCL 2 MG/2ML IJ SOLN
INTRAMUSCULAR | Status: AC
  Filled 2024-02-18: qty 2

## 2024-02-18 MED ORDER — PHENYLEPHRINE HCL 10 % OP SOLN
1.0000 [drp] | OPHTHALMIC | Status: AC | PRN
  Administered 2024-02-18 (×3): 1 [drp] via OPHTHALMIC

## 2024-02-18 MED ORDER — TETRACAINE HCL 0.5 % OP SOLN
OPHTHALMIC | Status: AC
  Filled 2024-02-18: qty 4

## 2024-02-18 NOTE — Anesthesia Postprocedure Evaluation (Signed)
"   Anesthesia Post Note  Patient: Suzanne [name]  Procedure(s) Performed: PHACOEMULSIFICATION, CATARACT, WITH IOL INSERTION 4.06 00:30.5 (Right: Eye)  Patient location during evaluation: PACU Anesthesia Type: General Level of consciousness: awake and alert Pain management: pain level controlled Vital Signs Assessment: post-procedure vital signs reviewed and stable Respiratory status: spontaneous breathing, nonlabored ventilation, respiratory function stable and patient connected to nasal cannula oxygen Cardiovascular status: blood pressure returned to baseline and stable Postop Assessment: no apparent nausea or vomiting Anesthetic complications: no   No notable events documented.   Last Vitals:  Vitals:   02/18/24 1257 02/18/24 1302  BP: 111/76 113/82  Pulse: 89 88  Resp: 14 15  Temp: 36.7 C 36.7 C  SpO2: 95% 96%    Last Pain:  Vitals:   02/18/24 1302  TempSrc:   PainSc: 0-No pain                 Fairy A Johnatha Zeidman      "

## 2024-02-18 NOTE — Op Note (Signed)
 LOCATION:  [facility]   PREOPERATIVE DIAGNOSIS:    Nuclear sclerotic cataract right eye. H25.11   POSTOPERATIVE DIAGNOSIS:  Nuclear sclerotic cataract right eye.     PROCEDURE:  Phacoemusification with posterior chamber intraocular lens placement of the right eye   ULTRASOUND TIME: Procedures: PHACOEMULSIFICATION, CATARACT, WITH IOL INSERTION 4.06 00:30.5 (Right)  LENS:   Implant Name Type Inv. Item Serial No. Manufacturer Lot No. LRB No. Used Action  LENS IOL TECNIS EYHANCE 21.5 - [id number] Intraocular Lens LENS IOL TECNIS EYHANCE 21.5 [id number] [employer]  Right 1 Implanted         SURGEON:  Dene FABIENE Etienne, MD   ANESTHESIA:  Topical with tetracaine  drops and 2% Xylocaine  jelly, augmented with 1% preservative-free intracameral lidocaine .    COMPLICATIONS:  None.   DESCRIPTION OF PROCEDURE:  The patient was identified in the holding room and transported to the operating room and placed in the supine position under the operating microscope.  The right eye was identified as the operative eye and it was prepped and draped in the usual sterile ophthalmic fashion.   A 1 millimeter clear-corneal paracentesis was made at the 12:00 position.  0.5 ml of preservative-free 1% lidocaine  was injected into the anterior chamber. The anterior chamber was filled with Viscoat viscoelastic.  A 2.4 millimeter keratome was used to make a near-clear corneal incision at the 9:00 position.  A curvilinear capsulorrhexis was made with a cystotome and capsulorrhexis forceps.  Balanced salt solution was used to hydrodissect and hydrodelineate the nucleus.   Phacoemulsification was then used in stop and chop fashion to remove the lens nucleus and epinucleus.  The remaining cortex was then removed using the irrigation and aspiration handpiece. Provisc was then placed into the capsular bag to distend it for lens placement.  A lens was then injected into the capsular bag.  The remaining  viscoelastic was aspirated.   Wounds were hydrated with balanced salt solution.  The anterior chamber was inflated to a physiologic pressure with balanced salt solution.  No wound leaks were noted. Cefuroxime  0.1 ml of a 10mg /ml solution was injected into the anterior chamber for a dose of 1 mg of intracameral antibiotic at the completion of the case.   Timolol  and Brimonidine  drops were applied to the eye.  The patient was taken to the recovery room in stable condition without complications of anesthesia or surgery.   Suzanne [name] 02/18/2024, 12:54 PM

## 2024-02-18 NOTE — H&P (Signed)
 " [facility]   Primary Care Physician:  Tobie Domino, MD Ophthalmologist: Dr. Dene Etienne  Pre-Procedure History & Physical: HPI:  Suzanne [name] is a 74 y.o. female here for ophthalmic surgery.   Past Medical History:  Diagnosis Date   Anxiety    Cancer (HCC) 1989   Hx: of colon cancer   Depression    Fibromyalgia    Gallstones 05/29/2012   Lap chole 7/02   Headache(784.0)    Hx: of migraines   Hypercholesteremia    Hypertension    IBS (irritable bowel syndrome)    Neuropathy    Ovarian cyst    Hx: of    Pneumonia few yrs ago   Pre-diabetes     Past Surgical History:  Procedure Laterality Date   BREAST BIOPSY Right 2009   benign   CHOLECYSTECTOMY N/A 07/29/2012   Procedure: LAPAROSCOPIC CHOLECYSTECTOMY WITH INTRAOPERATIVE CHOLANGIOGRAM;  Surgeon: Sherlean JINNY Laughter, MD;  Location: [facility] OR;  Service: General;  Laterality: N/A;   COLON RESECTION  1989   colon cancer   COLONOSCOPY WITH PROPOFOL  N/A 06/18/2016   Procedure: COLONOSCOPY WITH PROPOFOL ;  Surgeon: Vicci Gladis POUR, MD;  Location: [facility];  Service: Endoscopy;  Laterality: N/A;   colonscopy  every 5 yrs   DILATION AND CURETTAGE OF UTERUS      Hx: of over 30 years ago   LAPAROSCOPIC CHOLECYSTECTOMY     TOTAL ABDOMINAL HYSTERECTOMY  1996    Prior to Admission medications  Medication Sig Start Date End Date Taking? Authorizing Provider  pravastatin (PRAVACHOL) 80 MG tablet Take 80 mg by mouth daily.   Yes [provider]  acetaminophen  (TYLENOL ) 500 MG tablet Take 500 mg by mouth every 4 (four) hours as needed for mild pain (pain score 1-3).    [provider]  albuterol  (VENTOLIN  HFA) 108 (90 Base) MCG/ACT inhaler Inhale 2 puffs into the lungs every 4 (four) hours as needed.    [provider]  busPIRone  (BUSPAR ) 10 MG tablet Take 10 mg by mouth 2 (two) times daily.    [provider]  calcium  carbonate (OS-CAL - DOSED IN MG OF ELEMENTAL CALCIUM ) 1250  (500 Ca) MG tablet Take 1 tablet (1,250 mg total) by mouth 2 (two) times daily with a meal. 01/21/24   Caleen Qualia, MD  cetirizine (ZYRTEC) 10 MG tablet Take 10 mg by mouth at bedtime.    [provider]  doxycycline  (VIBRAMYCIN ) 50 MG capsule Take 50 mg by mouth daily. 01/12/24   [provider]  fenofibrate (TRICOR) 145 MG tablet Take 145 mg by mouth daily. 03/13/22   [provider]  fluticasone  (FLONASE ) 50 MCG/ACT nasal spray Place 2 sprays into the nose daily as needed for allergies.     [provider]  gabapentin  (NEURONTIN ) 800 MG tablet Take 800 mg by mouth 3 (three) times daily.    [provider]  metFORMIN  (GLUCOPHAGE ) 1000 MG tablet Take 1,000 mg by mouth daily. 11/10/23   [provider]  ondansetron  (ZOFRAN ) 4 MG tablet Take 1 tablet (4 mg total) by mouth every 6 (six) hours as needed for nausea. 01/21/24   Amin, Sumayya, MD  oxyCODONE  (OXY IR/ROXICODONE ) 5 MG immediate release tablet Take 1 tablet (5 mg total) by mouth every 6 (six) hours as needed for moderate pain (pain score 4-6) or severe pain (pain score 7-10). 01/21/24   Amin, Sumayya, MD  polyethylene glycol (MIRALAX  / GLYCOLAX ) 17 g packet Take 17 g by  mouth 2 (two) times daily. 01/21/24   Amin, Sumayya, MD  rosuvastatin  (CRESTOR ) 20 MG tablet Take 20 mg by mouth at bedtime.    [provider]  senna-docusate (SENOKOT-S) 8.6-50 MG tablet Take 2 tablets by mouth 2 (two) times daily. 01/21/24   Caleen Qualia, MD  spironolactone  (ALDACTONE ) 50 MG tablet Take 100 mg by mouth 2 (two) times daily.    [provider]  venlafaxine  XR (EFFEXOR -XR) 150 MG 24 hr capsule Take 150 mg by mouth 2 (two) times daily.    [provider]  Vitamin D , Ergocalciferol , (DRISDOL ) 1.25 MG (50000 UNIT) CAPS capsule Take 1 capsule (50,000 Units total) by mouth every 7 (seven) days. 01/25/24   Caleen Qualia, MD    Allergies as of 12/30/2023 - Review Complete 04/10/2022   Allergen Reaction Noted   Contrast media [iodinated contrast media] Swelling 05/29/2012   Latex Rash 07/29/2012    Family History  Problem Relation Age of Onset   Stomach cancer Father    Cancer Father    Cancer Sister    Breast cancer Paternal Aunt     Social History   Socioeconomic History   Marital status: Widowed    Spouse name: Not on file   Number of children: Not on file   Years of education: Not on file   Highest education level: Not on file  Occupational History   Not on file  Tobacco Use   Smoking status: Never   Smokeless tobacco: Never  Vaping Use   Vaping status: Never Used  Substance and Sexual Activity   Alcohol use: Never   Drug use: No   Sexual activity: Yes  Other Topics Concern   Not on file  Social History Narrative   Not on file   Social Drivers of Health   Tobacco Use: Low Risk (02/18/2024)   Patient History    Smoking Tobacco Use: Never    Smokeless Tobacco Use: Never    Passive Exposure: Not on file  Financial Resource Strain: Not on file  Food Insecurity: No Food Insecurity (01/17/2024)   Epic    Worried About Programme Researcher, Broadcasting/film/video in the Last Year: Never true    Ran Out of Food in the Last Year: Never true  Transportation Needs: No Transportation Needs (01/17/2024)   Epic    Lack of Transportation (Medical): No    Lack of Transportation (Non-Medical): No  Physical Activity: Not on file  Stress: Not on file  Social Connections: Unknown (01/17/2024)   Social Connection and Isolation Panel    Frequency of Communication with Friends and Family: Three times a week    Frequency of Social Gatherings with Friends and Family: Three times a week    Attends Religious Services: 1 to 4 times per year    Active Member of Clubs or Organizations: Patient declined    Attends Banker Meetings: Patient declined    Marital Status: Not on file  Intimate Partner Violence: Not At Risk (01/17/2024)   Epic    Fear of Current or Ex-Partner:  No    Emotionally Abused: No    Physically Abused: No    Sexually Abused: No  Depression (PHQ2-9): Not on file  Alcohol Screen: Not on file  Housing: Patient Declined (01/17/2024)   Epic    Unable to Pay for Housing in the Last Year: Patient declined    Number of Times Moved in the Last Year: Not on file    Homeless in the Last  Year: Patient declined  Utilities: Patient Declined (01/18/2024)   Epic    Threatened with loss of utilities: Patient declined  Health Literacy: Not on file    Review of Systems: See HPI, otherwise negative ROS  Physical Exam: BP 131/89   Pulse (!) 101   Temp 98.2 F (36.8 C) (Oral)   Ht 5' 6 (1.676 m)   Wt 77.1 kg   SpO2 98%   BMI 27.44 kg/m  General:   Alert,  pleasant and cooperative in NAD Head:  Normocephalic and atraumatic. Lungs:  Clear to auscultation.    Heart:  Regular rate and rhythm.   Impression/Plan: Suzanne [name] is here for ophthalmic surgery.  Risks, benefits, limitations, and alternatives regarding ophthalmic surgery have been reviewed with the patient.  Questions have been answered.  All parties agreeable.   Suzanne GASKIN, MD  02/18/2024, 11:42 AM  "

## 2024-02-18 NOTE — Anesthesia Preprocedure Evaluation (Signed)
"                                    Anesthesia Evaluation  Patient identified by MRN, date of birth, ID band Patient awake    Reviewed: Allergy & Precautions, H&P , NPO status , Patient's Chart, lab work & pertinent test results  Airway Mallampati: II  TM Distance: >3 FB Neck ROM: Full    Dental no notable dental hx.    Pulmonary neg pulmonary ROS   Pulmonary exam normal breath sounds clear to auscultation       Cardiovascular hypertension, negative cardio ROS Normal cardiovascular exam Rhythm:Regular Rate:Normal     Neuro/Psych negative neurological ROS  negative psych ROS   GI/Hepatic negative GI ROS, Neg liver ROS,,,  Endo/Other  negative endocrine ROS    Renal/GU negative Renal ROS  negative genitourinary   Musculoskeletal negative musculoskeletal ROS (+)    Abdominal   Peds negative pediatric ROS (+)  Hematology negative hematology ROS (+)   Anesthesia Other Findings   Reproductive/Obstetrics negative OB ROS                              Anesthesia Physical Anesthesia Plan  ASA: 2  Anesthesia Plan: General   Post-op Pain Management:    Induction: Intravenous  PONV Risk Score and Plan:   Airway Management Planned:   Additional Equipment:   Intra-op Plan:   Post-operative Plan: Extubation in OR  Informed Consent: I have reviewed the patients History and Physical, chart, labs and discussed the procedure including the risks, benefits and alternatives for the proposed anesthesia with the patient or authorized representative who has indicated his/her understanding and acceptance.     Dental advisory given  Plan Discussed with: CRNA  Anesthesia Plan Comments:         Anesthesia Quick Evaluation  "

## 2024-02-18 NOTE — Transfer of Care (Signed)
 Immediate Anesthesia Transfer of Care Note  Patient: Suzanne [name]  Procedure(s) Performed: PHACOEMULSIFICATION, CATARACT, WITH IOL INSERTION 4.06 00:30.5 (Right: Eye)  Patient Location: PACU  Anesthesia Type: General  Level of Consciousness: awake, alert  and patient cooperative  Airway and Oxygen Therapy: Patient Spontanous Breathing and Patient connected to supplemental oxygen  Post-op Assessment: Post-op Vital signs reviewed, Patient's Cardiovascular Status Stable, Respiratory Function Stable, Patent Airway and No signs of Nausea or vomiting  Post-op Vital Signs: Reviewed and stable  Complications: No notable events documented.

## 2024-02-19 ENCOUNTER — Encounter: Payer: Self-pay | Admitting: Ophthalmology

## 2024-03-02 NOTE — Discharge Instructions (Signed)

## 2024-03-03 ENCOUNTER — Other Ambulatory Visit: Payer: Self-pay

## 2024-03-03 ENCOUNTER — Encounter: Payer: Self-pay | Admitting: Ophthalmology

## 2024-03-03 ENCOUNTER — Encounter: Admission: RE | Disposition: A | Payer: Self-pay | Source: Home / Self Care | Attending: Ophthalmology

## 2024-03-03 ENCOUNTER — Ambulatory Visit
Admission: RE | Admit: 2024-03-03 | Discharge: 2024-03-03 | Disposition: A | Source: Home / Self Care | Attending: Ophthalmology | Admitting: Ophthalmology

## 2024-03-03 MED ORDER — MIDAZOLAM HCL 2 MG/2ML IJ SOLN
INTRAMUSCULAR | Status: AC
  Filled 2024-03-03: qty 2

## 2024-03-03 MED ORDER — SIGHTPATH DOSE#1 BSS IO SOLN
INTRAOCULAR | Status: DC | PRN
  Administered 2024-03-03: 56 mL via OPHTHALMIC

## 2024-03-03 MED ORDER — CYCLOPENTOLATE HCL 2 % OP SOLN
1.0000 [drp] | OPHTHALMIC | Status: AC | PRN
  Administered 2024-03-03 (×3): 1 [drp] via OPHTHALMIC

## 2024-03-03 MED ORDER — CYCLOPENTOLATE HCL 2 % OP SOLN: 1.0000 [drp] | OPHTHALMIC | Status: DC | PRN

## 2024-03-03 MED ORDER — MIDAZOLAM HCL (PF) 2 MG/2ML IJ SOLN
INTRAMUSCULAR | Status: DC | PRN
  Administered 2024-03-03: .5 mg via INTRAVENOUS
  Administered 2024-03-03: 1 mg via INTRAVENOUS

## 2024-03-03 MED ORDER — TETRACAINE HCL 0.5 % OP SOLN
OPHTHALMIC | Status: AC
  Filled 2024-03-03: qty 4

## 2024-03-03 MED ORDER — BRIMONIDINE TARTRATE-TIMOLOL 0.2-0.5 % OP SOLN
OPHTHALMIC | Status: DC | PRN
  Administered 2024-03-03: 1 [drp] via OPHTHALMIC

## 2024-03-03 MED ORDER — LACTATED RINGERS IV SOLN: INTRAVENOUS | Status: DC

## 2024-03-03 MED ORDER — CEFUROXIME OPHTHALMIC INJECTION 1 MG/0.1 ML
INJECTION | OPHTHALMIC | Status: DC | PRN
  Administered 2024-03-03: 1 mg via INTRACAMERAL

## 2024-03-03 MED ORDER — LIDOCAINE HCL (PF) 2 % IJ SOLN
INTRAOCULAR | Status: DC | PRN
  Administered 2024-03-03: 2 mL

## 2024-03-03 MED ORDER — PHENYLEPHRINE HCL 10 % OP SOLN
OPHTHALMIC | Status: AC
  Filled 2024-03-03: qty 5

## 2024-03-03 MED ORDER — FENTANYL CITRATE (PF) 100 MCG/2ML IJ SOLN
INTRAMUSCULAR | Status: AC
  Filled 2024-03-03: qty 2

## 2024-03-03 MED ORDER — SIGHTPATH DOSE#1 BSS IO SOLN
INTRAOCULAR | Status: DC | PRN
  Administered 2024-03-03: 15 mL via INTRAOCULAR

## 2024-03-03 MED ORDER — PHENYLEPHRINE HCL 10 % OP SOLN: 1.0000 [drp] | OPHTHALMIC | Status: DC | PRN

## 2024-03-03 MED ORDER — SIGHTPATH DOSE#1 NA HYALUR & NA CHOND-NA HYALUR IO KIT
PACK | INTRAOCULAR | Status: DC | PRN
  Administered 2024-03-03: 1 via OPHTHALMIC

## 2024-03-03 MED ORDER — TETRACAINE HCL 0.5 % OP SOLN
1.0000 [drp] | OPHTHALMIC | Status: DC | PRN
  Administered 2024-03-03 (×3): 1 [drp] via OPHTHALMIC

## 2024-03-03 MED ORDER — FENTANYL CITRATE (PF) 100 MCG/2ML IJ SOLN
INTRAMUSCULAR | Status: DC | PRN
  Administered 2024-03-03: 50 ug via INTRAVENOUS

## 2024-03-03 MED ORDER — PHENYLEPHRINE HCL 10 % OP SOLN
1.0000 [drp] | OPHTHALMIC | Status: AC | PRN
  Administered 2024-03-03 (×3): 1 [drp] via OPHTHALMIC

## 2024-03-03 MED ORDER — CYCLOPENTOLATE HCL 2 % OP SOLN
OPHTHALMIC | Status: AC
  Filled 2024-03-03: qty 2

## 2024-03-03 NOTE — Anesthesia Preprocedure Evaluation (Signed)
"                                    Anesthesia Evaluation  Patient identified by MRN, date of birth, ID band Patient awake    Reviewed: Allergy & Precautions, H&P , NPO status , Patient's Chart, lab work & pertinent test results  Airway Mallampati: II  TM Distance: >3 FB Neck ROM: Full    Dental no notable dental hx.    Pulmonary neg pulmonary ROS   Pulmonary exam normal breath sounds clear to auscultation       Cardiovascular hypertension, negative cardio ROS Normal cardiovascular exam Rhythm:Regular Rate:Normal     Neuro/Psych negative neurological ROS  negative psych ROS   GI/Hepatic negative GI ROS, Neg liver ROS,,,  Endo/Other  negative endocrine ROS    Renal/GU negative Renal ROS  negative genitourinary   Musculoskeletal negative musculoskeletal ROS (+)    Abdominal   Peds negative pediatric ROS (+)  Hematology negative hematology ROS (+)   Anesthesia Other Findings   Reproductive/Obstetrics negative OB ROS                              Anesthesia Physical Anesthesia Plan  ASA: 2  Anesthesia Plan: MAC   Post-op Pain Management:    Induction: Intravenous  PONV Risk Score and Plan:   Airway Management Planned:   Additional Equipment:   Intra-op Plan:   Post-operative Plan: Extubation in OR  Informed Consent: I have reviewed the patients History and Physical, chart, labs and discussed the procedure including the risks, benefits and alternatives for the proposed anesthesia with the patient or authorized representative who has indicated his/her understanding and acceptance.     Dental advisory given  Plan Discussed with: CRNA  Anesthesia Plan Comments:         Anesthesia Quick Evaluation  "

## 2024-03-03 NOTE — Transfer of Care (Signed)
 Immediate Anesthesia Transfer of Care Note  Patient: Suzanne [name]  Procedure(s) Performed: PHACOEMULSIFICATION, CATARACT, WITH IOL INSERTION 7.15 00:36.3 (Left)  Patient Location: PACU  Anesthesia Type: MAC  Level of Consciousness: awake, alert  and patient cooperative  Airway and Oxygen Therapy: Patient Spontanous Breathing and Patient connected to supplemental oxygen  Post-op Assessment: Post-op Vital signs reviewed, Patient's Cardiovascular Status Stable, Respiratory Function Stable, Patent Airway and No signs of Nausea or vomiting  Post-op Vital Signs: Reviewed and stable  Complications: No notable events documented.

## 2024-03-03 NOTE — H&P (Signed)
 " [facility]   Primary Care Physician:  Tobie Domino, MD Ophthalmologist: Dr. Dene Etienne  Pre-Procedure History & Physical: HPI:  Suzanne [name] is a 74 y.o. female here for ophthalmic surgery.   Past Medical History:  Diagnosis Date   Anxiety    Cancer (HCC) 1989   Hx: of colon cancer   Depression    Fibromyalgia    Gallstones 05/29/2012   Lap chole 7/02   Headache(784.0)    Hx: of migraines   Hypercholesteremia    Hypertension    IBS (irritable bowel syndrome)    Neuropathy    Ovarian cyst    Hx: of    Pneumonia few yrs ago   Pre-diabetes     Past Surgical History:  Procedure Laterality Date   BREAST BIOPSY Right 2009   benign   CATARACT EXTRACTION W/PHACO Right 02/18/2024   Procedure: PHACOEMULSIFICATION, CATARACT, WITH IOL INSERTION 4.06 00:30.5;  Surgeon: Etienne Dene, MD;  Location: [facility];  Service: Ophthalmology;  Laterality: Right;   CHOLECYSTECTOMY N/A 07/29/2012   Procedure: LAPAROSCOPIC CHOLECYSTECTOMY WITH INTRAOPERATIVE CHOLANGIOGRAM;  Surgeon: Sherlean JINNY Laughter, MD;  Location: [facility] OR;  Service: General;  Laterality: N/A;   COLON RESECTION  1989   colon cancer   COLONOSCOPY WITH PROPOFOL  N/A 06/18/2016   Procedure: COLONOSCOPY WITH PROPOFOL ;  Surgeon: Vicci Gladis POUR, MD;  Location: [facility];  Service: Endoscopy;  Laterality: N/A;   colonscopy  every 5 yrs   DILATION AND CURETTAGE OF UTERUS      Hx: of over 30 years ago   LAPAROSCOPIC CHOLECYSTECTOMY     TOTAL ABDOMINAL HYSTERECTOMY  1996    Prior to Admission medications  Medication Sig Start Date End Date Taking? Authorizing Provider  acetaminophen  (TYLENOL ) 500 MG tablet Take 500 mg by mouth every 4 (four) hours as needed for mild pain (pain score 1-3).   Yes [provider]  albuterol  (VENTOLIN  HFA) 108 (90 Base) MCG/ACT inhaler Inhale 2 puffs into the lungs every 4 (four) hours as needed.   Yes [provider]  busPIRone  (BUSPAR ) 10 MG  tablet Take 10 mg by mouth 2 (two) times daily.   Yes [provider]  calcium  carbonate (OS-CAL - DOSED IN MG OF ELEMENTAL CALCIUM ) 1250 (500 Ca) MG tablet Take 1 tablet (1,250 mg total) by mouth 2 (two) times daily with a meal. 01/21/24  Yes Amin, Sumayya, MD  cetirizine (ZYRTEC) 10 MG tablet Take 10 mg by mouth at bedtime.   Yes [provider]  doxycycline  (VIBRAMYCIN ) 50 MG capsule Take 50 mg by mouth daily. 01/12/24  Yes [provider]  fenofibrate (TRICOR) 145 MG tablet Take 145 mg by mouth daily. 03/13/22  Yes [provider]  fluticasone  (FLONASE ) 50 MCG/ACT nasal spray Place 2 sprays into the nose daily as needed for allergies.    Yes [provider]  gabapentin  (NEURONTIN ) 800 MG tablet Take 800 mg by mouth 3 (three) times daily.   Yes [provider]  metFORMIN  (GLUCOPHAGE ) 1000 MG tablet Take 1,000 mg by mouth daily. 11/10/23  Yes [provider]  oxyCODONE  (OXY IR/ROXICODONE ) 5 MG immediate release tablet Take 1 tablet (5 mg total) by mouth every 6 (six) hours as needed for moderate pain (pain score 4-6) or severe pain (pain score 7-10). 01/21/24  Yes Amin, Sumayya, MD  polyethylene glycol (MIRALAX  / GLYCOLAX ) 17 g packet Take 17 g by mouth 2 (two) times daily. 01/21/24  Yes Amin, Sumayya, MD  pravastatin (  PRAVACHOL) 80 MG tablet Take 80 mg by mouth daily.   Yes [provider]  rosuvastatin  (CRESTOR ) 20 MG tablet Take 20 mg by mouth at bedtime.   Yes [provider]  senna-docusate (SENOKOT-S) 8.6-50 MG tablet Take 2 tablets by mouth 2 (two) times daily. 01/21/24  Yes Caleen Qualia, MD  spironolactone  (ALDACTONE ) 50 MG tablet Take 100 mg by mouth 2 (two) times daily.   Yes [provider]  venlafaxine  XR (EFFEXOR -XR) 150 MG 24 hr capsule Take 150 mg by mouth 2 (two) times daily.   Yes [provider]  Vitamin D , Ergocalciferol , (DRISDOL ) 1.25 MG (50000 UNIT) CAPS capsule Take 1 capsule  (50,000 Units total) by mouth every 7 (seven) days. 01/25/24  Yes Caleen Qualia, MD  ondansetron  (ZOFRAN ) 4 MG tablet Take 1 tablet (4 mg total) by mouth every 6 (six) hours as needed for nausea. 01/21/24   Caleen Qualia, MD    Allergies as of 12/30/2023 - Review Complete 04/10/2022  Allergen Reaction Noted   Contrast media [iodinated contrast media] Swelling 05/29/2012   Latex Rash 07/29/2012    Family History  Problem Relation Age of Onset   Stomach cancer Father    Cancer Father    Cancer Sister    Breast cancer Paternal Aunt     Social History   Socioeconomic History   Marital status: Widowed    Spouse name: Not on file   Number of children: Not on file   Years of education: Not on file   Highest education level: Not on file  Occupational History   Not on file  Tobacco Use   Smoking status: Never   Smokeless tobacco: Never  Vaping Use   Vaping status: Never Used  Substance and Sexual Activity   Alcohol use: Never   Drug use: No   Sexual activity: Yes  Other Topics Concern   Not on file  Social History Narrative   Not on file   Social Drivers of Health   Tobacco Use: Low Risk (03/03/2024)   Patient History    Smoking Tobacco Use: Never    Smokeless Tobacco Use: Never    Passive Exposure: Not on file  Financial Resource Strain: Not on file  Food Insecurity: No Food Insecurity (01/17/2024)   Epic    Worried About Programme Researcher, Broadcasting/film/video in the Last Year: Never true    Ran Out of Food in the Last Year: Never true  Transportation Needs: No Transportation Needs (01/17/2024)   Epic    Lack of Transportation (Medical): No    Lack of Transportation (Non-Medical): No  Physical Activity: Not on file  Stress: Not on file  Social Connections: Unknown (01/17/2024)   Social Connection and Isolation Panel    Frequency of Communication with Friends and Family: Three times a week    Frequency of Social Gatherings with Friends and Family: Three times a week    Attends  Religious Services: 1 to 4 times per year    Active Member of Clubs or Organizations: Patient declined    Attends Banker Meetings: Patient declined    Marital Status: Not on file  Intimate Partner Violence: Not At Risk (01/17/2024)   Epic    Fear of Current or Ex-Partner: No    Emotionally Abused: No    Physically Abused: No    Sexually Abused: No  Depression (PHQ2-9): Not on file  Alcohol Screen: Not on file  Housing: Patient Declined (01/17/2024)   Epic  Unable to Pay for Housing in the Last Year: Patient declined    Number of Times Moved in the Last Year: Not on file    Homeless in the Last Year: Patient declined  Utilities: Patient Declined (01/18/2024)   Epic    Threatened with loss of utilities: Patient declined  Health Literacy: Not on file    Review of Systems: See HPI, otherwise negative ROS  Physical Exam: BP 134/85   Temp (!) 96.9 F (36.1 C)   Resp 10   Ht 5' 5.98 (1.676 m)   Wt 77.9 kg   SpO2 97%   BMI 27.73 kg/m  General:   Alert,  pleasant and cooperative in NAD Head:  Normocephalic and atraumatic. Lungs:  Clear to auscultation.    Heart:  Regular rate and rhythm.   Impression/Plan: Suzanne [name] is here for ophthalmic surgery.  Risks, benefits, limitations, and alternatives regarding ophthalmic surgery have been reviewed with the patient.  Questions have been answered.  All parties agreeable.   MITTIE GASKIN, MD  03/03/2024, 11:36 AM  "

## 2024-03-03 NOTE — Op Note (Signed)
 OPERATIVE NOTE  Suzanne [name] [id number] 03/03/2024   PREOPERATIVE DIAGNOSIS:  Nuclear sclerotic cataract left eye. H25.12   POSTOPERATIVE DIAGNOSIS:    Nuclear sclerotic cataract left eye.     PROCEDURE:  Phacoemusification with posterior chamber intraocular lens placement of the left eye  Ultrasound time: Procedures: PHACOEMULSIFICATION, CATARACT, WITH IOL INSERTION 7.15 00:36.3 (Left)  LENS:   Implant Name Type Inv. Item Serial No. Manufacturer Lot No. LRB No. Used Action  LENS IOL TECNIS EYHANCE 22.0 - [id number] Intraocular Lens LENS IOL TECNIS EYHANCE 22.0 [id number] [employer]  Left 1 Implanted      SURGEON:  Dene FABIENE Etienne, MD   ANESTHESIA:  Topical with tetracaine  drops and 2% Xylocaine  jelly, augmented with 1% preservative-free intracameral lidocaine .    COMPLICATIONS:  None.   DESCRIPTION OF PROCEDURE:  The patient was identified in the holding room and transported to the operating room and placed in the supine position under the operating microscope.  The left eye was identified as the operative eye and it was prepped and draped in the usual sterile ophthalmic fashion.   A 1 millimeter clear-corneal paracentesis was made at the 1:30 position.  0.5 ml of preservative-free 1% lidocaine  was injected into the anterior chamber.  The anterior chamber was filled with Viscoat viscoelastic.  A 2.4 millimeter keratome was used to make a near-clear corneal incision at the 10:30 position.  .  A curvilinear capsulorrhexis was made with a cystotome and capsulorrhexis forceps.  Balanced salt solution was used to hydrodissect and hydrodelineate the nucleus.   Phacoemulsification was then used in stop and chop fashion to remove the lens nucleus and epinucleus.  The remaining cortex was then removed using the irrigation and aspiration handpiece. Provisc was then placed into the capsular bag to distend it for lens placement.  A lens was then injected into the capsular bag.  The  remaining viscoelastic was aspirated.   Wounds were hydrated with balanced salt solution.  The anterior chamber was inflated to a physiologic pressure with balanced salt solution.  No wound leaks were noted. Cefuroxime  0.1 ml of a 10mg /ml solution was injected into the anterior chamber for a dose of 1 mg of intracameral antibiotic at the completion of the case.   Timolol  and Brimonidine  drops were applied to the eye.  The patient was taken to the recovery room in stable condition without complications of anesthesia or surgery.  [name] 03/03/2024, 11:52 AM

## 2024-03-03 NOTE — Anesthesia Postprocedure Evaluation (Signed)
"   Anesthesia Post Note  Patient: Suzanne [name]  Procedure(s) Performed: PHACOEMULSIFICATION, CATARACT, WITH IOL INSERTION 7.15 00:36.3 (Left)  Patient location during evaluation: PACU Anesthesia Type: MAC Level of consciousness: awake and alert Pain management: pain level controlled Vital Signs Assessment: post-procedure vital signs reviewed and stable Respiratory status: spontaneous breathing, nonlabored ventilation, respiratory function stable and patient connected to nasal cannula oxygen Cardiovascular status: blood pressure returned to baseline and stable Postop Assessment: no apparent nausea or vomiting Anesthetic complications: no   No notable events documented.   Last Vitals:  Vitals:   03/03/24 1155 03/03/24 1156  BP:    Pulse:  99  Resp:  (!) 9  Temp: 36.8 C   SpO2:  94%    Last Pain:  Vitals:   03/03/24 1156  PainSc: 0-No pain                 [name]      "
